# Patient Record
Sex: Male | Born: 1945 | Race: White | Hispanic: No | Marital: Married | Smoking: Former smoker
Health system: Southern US, Community
[De-identification: ages and names within clinical notes are randomized; demographics above are authoritative.]

## PROBLEM LIST (undated history)

## (undated) DIAGNOSIS — E119 Type 2 diabetes mellitus without complications: Secondary | ICD-10-CM

## (undated) DIAGNOSIS — I10 Essential (primary) hypertension: Secondary | ICD-10-CM

## (undated) DIAGNOSIS — E785 Hyperlipidemia, unspecified: Secondary | ICD-10-CM

## (undated) DIAGNOSIS — F419 Anxiety disorder, unspecified: Secondary | ICD-10-CM

## (undated) DIAGNOSIS — I639 Cerebral infarction, unspecified: Secondary | ICD-10-CM

## (undated) DIAGNOSIS — G2581 Restless legs syndrome: Secondary | ICD-10-CM

## (undated) DIAGNOSIS — Z8679 Personal history of other diseases of the circulatory system: Secondary | ICD-10-CM

## (undated) HISTORY — DX: Personal history of other diseases of the circulatory system: Z86.79

## (undated) HISTORY — PX: CORONARY ANGIOPLASTY WITH STENT PLACEMENT: SHX49

## (undated) HISTORY — DX: Hyperlipidemia, unspecified: E78.5

## (undated) HISTORY — DX: Essential (primary) hypertension: I10

## (undated) HISTORY — DX: Anxiety disorder, unspecified: F41.9

## (undated) HISTORY — DX: Restless legs syndrome: G25.81

## (undated) HISTORY — DX: Cerebral infarction, unspecified: I63.9

## (undated) HISTORY — DX: Type 2 diabetes mellitus without complications: E11.9

---

## 2014-12-12 ENCOUNTER — Ambulatory Visit (INDEPENDENT_AMBULATORY_CARE_PROVIDER_SITE_OTHER): Payer: Medicare Other

## 2014-12-12 ENCOUNTER — Ambulatory Visit (INDEPENDENT_AMBULATORY_CARE_PROVIDER_SITE_OTHER): Payer: Medicare Other | Admitting: Family Medicine

## 2014-12-12 VITALS — BP 149/78 | HR 66 | Temp 97.5°F | Resp 16 | Ht 70.75 in | Wt 202.0 lb

## 2014-12-12 DIAGNOSIS — K30 Functional dyspepsia: Secondary | ICD-10-CM

## 2014-12-12 DIAGNOSIS — R1013 Epigastric pain: Secondary | ICD-10-CM

## 2014-12-12 DIAGNOSIS — R079 Chest pain, unspecified: Secondary | ICD-10-CM | POA: Diagnosis not present

## 2014-12-12 NOTE — Progress Notes (Signed)
Subjective:    Patient ID: Derek Blankenship, male    DOB: 06-07-1946, 69 y.o.   MRN: [id number]  HPI  This is a 69 year old male with PMH HTN, HLD and BPH who is presenting with intermittent CP x 2 days. Him and his wife are currently traveling from [place] back to [place] (home). States yesterday morning he had sudden CP prior to having breakfast. State it felt like "an intense burp". CP is substernal with mild radiation into his neck and upper back. He went to the bathroom to try to vomit but nothing came up. He took 4 tums and CP gradually resolved. He denies SOB, palpitations, diaphoresis, numbness. Today he had another episode of CP 2 hours ago that occurred after unloading the car. He again took 4 tums - pain has lessened but is still present. Pt bikes 30 miles a day. States over the past 2 weeks he has had mild CP with sensation of needing to burp in the mornings at the start of his bike ride that resolves after a few miles. He does not take anything for heartburn regularly. He has never had a cardiology work up before.   Review of Systems  Constitutional: Negative for fever, chills and diaphoresis.  HENT: Negative for sore throat and trouble swallowing.   Eyes: Negative for visual disturbance.  Respiratory: Negative for shortness of breath.   Cardiovascular: Positive for chest pain. Negative for palpitations and leg swelling.  Gastrointestinal: Negative for vomiting, abdominal pain and diarrhea.  Skin: Negative for rash.  Neurological: Negative for dizziness, syncope, weakness and numbness.   There are no active problems to display for this patient.  Prior to Admission medications   Medication Sig Start Date End Date Taking? Authorizing Provider  aspirin EC 81 MG tablet Take 81 mg by mouth daily.   Yes Historical Provider, MD  dutasteride (AVODART) 0.5 MG capsule Take 0.5 mg by mouth daily.   Yes Historical Provider, MD  irbesartan (AVAPRO) 150 MG tablet Take 150 mg by mouth daily.   Yes  Historical Provider, MD  sertraline (ZOLOFT) 50 MG tablet Take 50 mg by mouth daily.   Yes Historical Provider, MD  simvastatin (ZOCOR) 10 MG tablet Take 10 mg by mouth daily.   Yes Historical Provider, MD   Allergies not on file  Patient's social and family history were reviewed.     Objective:   Physical Exam  Constitutional: He is oriented to person, place, and time. He appears well-developed and well-nourished. No distress.  HENT:  Head: Normocephalic and atraumatic.  Right Ear: Hearing normal.  Left Ear: Hearing normal.  Nose: Nose normal.  Eyes: Conjunctivae and lids are normal. Right eye exhibits no discharge. Left eye exhibits no discharge. No scleral icterus.  Neck: Carotid bruit is not present.  Cardiovascular: Normal rate, regular rhythm, normal heart sounds and normal pulses.   No murmur heard. Pulmonary/Chest: Effort normal and breath sounds normal. No respiratory distress. He has no wheezes. He has no rhonchi. He has no rales. He exhibits no tenderness.  Abdominal: Soft. Normal appearance. There is no tenderness.  Musculoskeletal: Normal range of motion.  Lymphadenopathy:    He has no cervical adenopathy.  Neurological: He is alert and oriented to person, place, and time. Gait normal.  Skin: Skin is warm, dry and intact. No lesion and no rash noted.  Psychiatric: He has a normal mood and affect. His speech is normal and behavior is normal. Thought content normal.   BP 169/76  mmHg  Pulse 66  Temp(Src) 97.5 F (36.4 C) (Oral)  Resp 16  Ht 5' 10.75" (1.797 m)  Wt 202 lb (91.627 kg)  BMI 28.37 kg/m2  SpO2 96%  BP recheck 149/78  [facility] reading (PRIMARY) by  Dr. Patsy Lageropland: negative  EKG interpreted with Dr. Patsy Lageropland: NSR     Assessment & Plan:  1. Chest pain, unspecified chest pain type 2. indigestion EKG and chest radiograph normal. CP "resolved 90%" with GI cocktail. CP likely d/t indigestion although cardiac etiology cannot be ruled out. Pt opted to try taking  zantac BID to prevent heartburn instead of going to the hospital. Wife insisted if CP occurred again they would go straight to the ED. Advised pt likely needed a stress test. When they arrive back in [place] pt will make appt with PCP and get cardiology work up.  - EKG 12-Lead - DG Chest 2 View; Future - GI cocktail     Roswell MinersNicole V. Dyke BrackettBush, PA-C, MHS [facility]  12/14/2014

## 2014-12-12 NOTE — Patient Instructions (Signed)
Buy zantac at the pharmacy and take in the mornings before breakfast. May take in the evenings before bed as well. If you continue to have episodes of intense chest pain, you need to go to the ED. When you get home, set up an appt with your primary doctor and it would be a good idea to get an exercise stress test.

## 2014-12-13 NOTE — Progress Notes (Signed)
I have examined pt along with Ms. Danae OrleansBush.  Agree that EKG is normal, CXR normal.  Pt reported that GI cocktail resolved "at least 90%" of his chest pain.  Counseled pt and his wife that although this is good news, it is still possible that he has a cardiac origin of his CP.  Advised him that to be certain I would recommend that he go to the ER for a chest pain rule-out.  They declined to do this now, but promised to seek care with their PCP when he gets home, and to discuss cardiology follow-up with PCP.  Also agreed to seek care right away if his CP returns or persists.  His wife informed me that he is has CP again she will insist that they "call [phone]"

## 2014-12-14 ENCOUNTER — Encounter: Payer: Self-pay | Admitting: Physician Assistant

## 2014-12-14 MED ORDER — GI COCKTAIL ~~LOC~~: 30.0000 mL | Freq: Once | ORAL | Status: AC

## 2019-01-12 ENCOUNTER — Observation Stay
Admission: EM | Admit: 2019-01-12 | Discharge: 2019-01-14 | Disposition: A | Discharge status: Home / Self Care | Payer: Medicare Other | Attending: Family Medicine | Admitting: Family Medicine

## 2019-01-12 ENCOUNTER — Emergency Department: Payer: Medicare Other

## 2019-01-12 ENCOUNTER — Emergency Department: Payer: Self-pay

## 2019-01-12 DIAGNOSIS — Z7982 Long term (current) use of aspirin: Secondary | ICD-10-CM | POA: Insufficient documentation

## 2019-01-12 DIAGNOSIS — R079 Chest pain, unspecified: Secondary | ICD-10-CM | POA: Diagnosis present

## 2019-01-12 DIAGNOSIS — Z8673 Personal history of transient ischemic attack (TIA), and cerebral infarction without residual deficits: Secondary | ICD-10-CM | POA: Insufficient documentation

## 2019-01-12 DIAGNOSIS — N400 Enlarged prostate without lower urinary tract symptoms: Secondary | ICD-10-CM | POA: Insufficient documentation

## 2019-01-12 DIAGNOSIS — I10 Essential (primary) hypertension: Secondary | ICD-10-CM | POA: Insufficient documentation

## 2019-01-12 DIAGNOSIS — F419 Anxiety disorder, unspecified: Secondary | ICD-10-CM | POA: Insufficient documentation

## 2019-01-12 DIAGNOSIS — R0602 Shortness of breath: Secondary | ICD-10-CM

## 2019-01-12 DIAGNOSIS — F329 Major depressive disorder, single episode, unspecified: Secondary | ICD-10-CM | POA: Insufficient documentation

## 2019-01-12 DIAGNOSIS — R0789 Other chest pain: Principal | ICD-10-CM | POA: Insufficient documentation

## 2019-01-12 DIAGNOSIS — Z955 Presence of coronary angioplasty implant and graft: Secondary | ICD-10-CM | POA: Insufficient documentation

## 2019-01-12 DIAGNOSIS — E785 Hyperlipidemia, unspecified: Secondary | ICD-10-CM | POA: Insufficient documentation

## 2019-01-12 DIAGNOSIS — I2510 Atherosclerotic heart disease of native coronary artery without angina pectoris: Secondary | ICD-10-CM | POA: Insufficient documentation

## 2019-01-12 DIAGNOSIS — E119 Type 2 diabetes mellitus without complications: Secondary | ICD-10-CM | POA: Insufficient documentation

## 2019-01-12 DIAGNOSIS — G2581 Restless legs syndrome: Secondary | ICD-10-CM | POA: Insufficient documentation

## 2019-01-12 DIAGNOSIS — Z87891 Personal history of nicotine dependence: Secondary | ICD-10-CM | POA: Insufficient documentation

## 2019-01-12 DIAGNOSIS — J929 Pleural plaque without asbestos: Secondary | ICD-10-CM

## 2019-01-12 LAB — CBC AND DIFFERENTIAL
Basophils %: 0.5 % (ref 0.0–3.0)
Basophils Absolute: 0 10*3/uL (ref 0.0–0.3)
Eosinophils %: 4.7 % (ref 0.0–7.0)
Eosinophils Absolute: 0.3 10*3/uL (ref 0.0–0.8)
Hematocrit: 43.8 % (ref 39.0–52.5)
Hemoglobin: 14.9 gm/dL (ref 13.0–17.5)
Lymphocytes Absolute: 1.5 10*3/uL (ref 0.6–5.1)
Lymphocytes: 24.9 % (ref 15.0–46.0)
MCH: 31 pg (ref 28–35)
MCHC: 34 gm/dL (ref 32–36)
MCV: 91 fL (ref 80–100)
MPV: 7.6 fL (ref 6.0–10.0)
Monocytes Absolute: 0.6 10*3/uL (ref 0.1–1.7)
Monocytes: 9.3 % (ref 3.0–15.0)
Neutrophils %: 60.6 % (ref 42.0–78.0)
Neutrophils Absolute: 3.6 10*3/uL (ref 1.7–8.6)
PLT CT: 185 10*3/uL (ref 130–440)
RBC: 4.81 10*6/uL (ref 4.00–5.70)
RDW: 12.1 % (ref 11.0–14.0)
WBC: 5.9 10*3/uL (ref 4.0–11.0)

## 2019-01-12 LAB — COMPREHENSIVE METABOLIC PANEL
ALT: 18 U/L (ref 0–55)
AST (SGOT): 17 U/L (ref 10–42)
Albumin/Globulin Ratio: 1.44 Ratio (ref 0.80–2.00)
Albumin: 3.9 gm/dL (ref 3.5–5.0)
Alkaline Phosphatase: 117 U/L (ref 40–145)
Anion Gap: 13.8 mMol/L (ref 7.0–18.0)
BUN / Creatinine Ratio: 10.9 Ratio (ref 10.0–30.0)
BUN: 12 mg/dL (ref 7–22)
Bilirubin, Total: 0.5 mg/dL (ref 0.1–1.2)
CO2: 22 mMol/L (ref 20.0–30.0)
Calcium: 8.9 mg/dL (ref 8.5–10.5)
Chloride: 104 mMol/L (ref 98–110)
Creatinine: 1.1 mg/dL (ref 0.80–1.30)
EGFR: 67 mL/min/{1.73_m2} (ref 60–150)
Globulin: 2.7 gm/dL (ref 2.0–4.0)
Glucose: 146 mg/dL — ABNORMAL HIGH (ref 71–99)
Osmolality Calculated: 274 mOsm/kg — ABNORMAL LOW (ref 275–300)
Potassium: 3.8 mMol/L (ref 3.5–5.3)
Protein, Total: 6.6 gm/dL (ref 6.0–8.3)
Sodium: 136 mMol/L (ref 136–147)

## 2019-01-12 LAB — D-DIMER, QUANTITATIVE: D-Dimer: 0.98 mg/L FEU — ABNORMAL HIGH (ref 0.19–0.52)

## 2019-01-12 LAB — ECG 12-LEAD
P Wave Axis: 36 deg
P-R Interval: 136 ms
Patient Age: 72 years
Q-T Interval(Corrected): 440 ms
Q-T Interval: 447 ms
QRS Axis: -7 deg
QRS Duration: 102 ms
T Axis: 80 years
Ventricular Rate: 58 //min

## 2019-01-12 LAB — TROPONIN I
Troponin I: 0.02 ng/mL (ref 0.00–0.02)
Troponin I: 0.02 ng/mL (ref 0.00–0.02)
Troponin I: 0.01 ng/mL (ref 0.00–0.02)

## 2019-01-12 LAB — PROCALCITONIN: Procalcitonin: 0.05 ng/mL (ref 0.00–0.24)

## 2019-01-12 LAB — C-REACTIVE PROTEIN: C-Reactive Protein: 1.1 mg/dL — ABNORMAL HIGH (ref 0.02–0.80)

## 2019-01-12 MED ORDER — TERAZOSIN HCL 1 MG PO CAPS
1.00 mg | ORAL_CAPSULE | Freq: Every evening | ORAL | Status: DC
Start: 2019-01-12 — End: 2019-01-14
  Administered 2019-01-12 – 2019-01-13 (×2): 1 mg via ORAL
  Filled 2019-01-12 (×2): qty 1

## 2019-01-12 MED ORDER — SODIUM CHLORIDE (PF) 0.9 % IJ SOLN
3.00 mL | Freq: Three times a day (TID) | INTRAMUSCULAR | Status: DC
Start: 2019-01-12 — End: 2019-01-14
  Administered 2019-01-12 – 2019-01-13 (×2): 3 mL via INTRAVENOUS

## 2019-01-12 MED ORDER — RANOLAZINE ER 500 MG PO TB12
1000.00 mg | ORAL_TABLET | Freq: Two times a day (BID) | ORAL | Status: DC
Start: 2019-01-12 — End: 2019-01-14
  Administered 2019-01-12 – 2019-01-14 (×5): 1000 mg via ORAL
  Filled 2019-01-12 (×5): qty 2

## 2019-01-12 MED ORDER — MORPHINE SULFATE 2 MG/ML IJ/IV SOLN (WRAP)
2.0000 mg | Status: DC | PRN
Start: 2019-01-12 — End: 2019-01-14

## 2019-01-12 MED ORDER — NALOXONE HCL 0.4 MG/ML IJ SOLN (WRAP)
0.40 mg | INTRAMUSCULAR | Status: DC | PRN
Start: 2019-01-12 — End: 2019-01-14

## 2019-01-12 MED ORDER — ISOSORBIDE MONONITRATE 20 MG PO TABS
60.0000 mg | ORAL_TABLET | Freq: Every day | ORAL | Status: DC
Start: 2019-01-12 — End: 2019-01-14
  Administered 2019-01-12 – 2019-01-14 (×3): 60 mg via ORAL
  Filled 2019-01-12 (×3): qty 3

## 2019-01-12 MED ORDER — IOHEXOL 350 MG/ML IV SOLN
70.00 mL | Freq: Once | INTRAVENOUS | Status: AC | PRN
Start: 2019-01-12 — End: 2019-01-12
  Administered 2019-01-12: 03:00:00 70 mL via INTRAVENOUS

## 2019-01-12 MED ORDER — VH BIO-K PLUS PROBIOTIC 50 BIL CFU CAPSULE
50.00 | DELAYED_RELEASE_CAPSULE | Freq: Every day | ORAL | Status: DC
Start: 2019-01-12 — End: 2019-01-14
  Administered 2019-01-12 – 2019-01-14 (×3): 50 via ORAL
  Filled 2019-01-12 (×3): qty 1

## 2019-01-12 MED ORDER — CYCLOBENZAPRINE HCL 10 MG PO TABS
5.0000 mg | ORAL_TABLET | Freq: Three times a day (TID) | ORAL | Status: DC | PRN
Start: 2019-01-12 — End: 2019-01-14

## 2019-01-12 MED ORDER — HYDROXYZINE HCL 25 MG PO TABS
75.0000 mg | ORAL_TABLET | Freq: Every evening | ORAL | Status: DC
Start: 2019-01-12 — End: 2019-01-14
  Administered 2019-01-12 – 2019-01-13 (×2): 75 mg via ORAL
  Filled 2019-01-12 (×2): qty 3

## 2019-01-12 MED ORDER — ONDANSETRON 4 MG PO TBDP
4.00 mg | ORAL_TABLET | Freq: Three times a day (TID) | ORAL | Status: DC | PRN
Start: 2019-01-12 — End: 2019-01-14

## 2019-01-12 MED ORDER — ASPIRIN 81 MG PO CHEW
CHEWABLE_TABLET | ORAL | Status: AC
Start: 2019-01-12 — End: ?
  Filled 2019-01-12: qty 4

## 2019-01-12 MED ORDER — AZITHROMYCIN 500 MG IV SOLR
500.00 mg | INTRAVENOUS | Status: DC
Start: 2019-01-12 — End: 2019-01-12
  Administered 2019-01-12: 07:00:00 500 mg via INTRAVENOUS
  Filled 2019-01-12: qty 500

## 2019-01-12 MED ORDER — ASPIRIN 81 MG PO CHEW
81.00 mg | CHEWABLE_TABLET | Freq: Every day | ORAL | Status: DC
Start: 2019-01-13 — End: 2019-01-14
  Administered 2019-01-13 – 2019-01-14 (×2): 81 mg via ORAL
  Filled 2019-01-12 (×2): qty 1

## 2019-01-12 MED ORDER — LISINOPRIL 20 MG PO TABS
20.0000 mg | ORAL_TABLET | Freq: Every day | ORAL | Status: DC
Start: 2019-01-12 — End: 2019-01-14
  Administered 2019-01-12 – 2019-01-14 (×3): 20 mg via ORAL
  Filled 2019-01-12 (×3): qty 1

## 2019-01-12 MED ORDER — ISOSORBIDE MONONITRATE 20 MG PO TABS
60.0000 mg | ORAL_TABLET | Freq: Two times a day (BID) | ORAL | Status: DC
Start: 2019-01-12 — End: 2019-01-12

## 2019-01-12 MED ORDER — TRAZODONE HCL 50 MG PO TABS
100.0000 mg | ORAL_TABLET | Freq: Every evening | ORAL | Status: DC
Start: 2019-01-12 — End: 2019-01-14
  Administered 2019-01-12 – 2019-01-13 (×2): 100 mg via ORAL
  Filled 2019-01-12 (×2): qty 2

## 2019-01-12 MED ORDER — ATORVASTATIN CALCIUM 40 MG PO TABS
80.0000 mg | ORAL_TABLET | Freq: Every evening | ORAL | Status: DC
Start: 2019-01-12 — End: 2019-01-14
  Administered 2019-01-12 – 2019-01-13 (×2): 80 mg via ORAL
  Filled 2019-01-12 (×2): qty 2

## 2019-01-12 MED ORDER — SODIUM CHLORIDE 0.9 % IV SOLN
INTRAVENOUS | Status: DC
Start: 2019-01-12 — End: 2019-01-13

## 2019-01-12 MED ORDER — ENOXAPARIN SODIUM 40 MG/0.4ML SC SOLN
40.00 mg | SUBCUTANEOUS | Status: DC
Start: 2019-01-12 — End: 2019-01-13
  Administered 2019-01-12 – 2019-01-13 (×2): 40 mg via SUBCUTANEOUS
  Filled 2019-01-12 (×2): qty 0.4

## 2019-01-12 MED ORDER — ONDANSETRON HCL 4 MG/2ML IJ SOLN
4.00 mg | Freq: Three times a day (TID) | INTRAMUSCULAR | Status: DC | PRN
Start: 2019-01-12 — End: 2019-01-14

## 2019-01-12 MED ORDER — ASPIRIN 81 MG PO CHEW
324.00 mg | CHEWABLE_TABLET | Freq: Once | ORAL | Status: AC
Start: 2019-01-12 — End: 2019-01-12
  Administered 2019-01-12: 04:00:00 324 mg via ORAL

## 2019-01-12 NOTE — Progress Notes (Addendum)
Report received from [employer]. Patient resting with eyes closed, call bell in reach. No patient requests at this time.

## 2019-01-12 NOTE — H&P (Signed)
Medicine History & Physical   [facility]  [employer]   Patient Name: Trevor Fisher, Trevor Fisher LOS: 0 days   Attending Physician: Wynonia Hazard, MD PCP: Pcp, None, MD      Assessment and Plan:                                                              Chest pain:  The patient has a history of coronary artery disease with stent placement in the past.  He does have chronic chest pain requiring Imdur and Ranexa.  Onset of the chest pain was yesterday, this chest pain is right-sided, less likely to be cardiac.  Troponin remains within normal range.  The chest pain is reproducible on palpation and there is a musculoskeletal component to it.  CT of the chest revealed no evidence of pulmonary embolism.   Placed the patient on observation   Obtain serial cardiac enzymes and EKGs   Continue aspirin, statins, lisinopril.   Continue Imdur and Ranexa.    Abnormal CT of the chest:  CT revealed no evidence of pulmonary embolus, mild bilateral peribronchial thickening and groundglass opacity favoring infection/inflammation.  There is evidence of bilateral scattered pleural thickening.  The patient has a history of asbestosis for which she will need outpatient follow-up.  Repeat CT in 6 months is recommended.  Initiate intravenous antibiotics for possible infection and assess for clinical response.    Essential hypertension:  Blood pressure controlled.  Continue lisinopril.    Hyperlipidemia:  Continue atorvastatin.    Anxiety/depression:  Continue trazodone.    DVT PPx: Lovenox   Dispo: Observation  Healthcare Proxy: Spouse  Code: full code     History of Presenting Illness                                Chief complaint: Chest pain.  Trevor Fisher is a 73 y.o. male patient with past medical history significant for coronary disease status post stent placement, hypertension, hyperlipidemia, anxiety, depression, who presented to the emergency department with complaints of chest pain for 2 days.  Onset of the chest  pain was 1 day prior to presentation.  At the time the chest pain was right-sided, rated 10 out of 10, intermittent.  The chest pain radiates to his back and right shoulder.  He admits to shortness of breath that is chronic and unchanged.  No reported lower extremity swelling.  The patient admits to a mild nonproductive cough.  No palpitation, no PND, no orthopnea.  No abdominal pain.  The patient is currently visiting [place] from [place].  He denies fever.  No reported sick contact.  Past Medical History:   Diagnosis Date    Anxiety     Cerebrovascular accident     History of angina     Hyperlipidemia     Hypertension     Restless leg syndrome      Past Surgical History:   Procedure Laterality Date    CORONARY ANGIOPLASTY WITH STENT PLACEMENT       History reviewed. No pertinent family history.  Social History     Tobacco Use    Smoking status: Former Smoker    Smokeless tobacco: Never Used   Substance  Use Topics    Alcohol use: Not Currently     Frequency: Never     Comment: occ beer    Drug use: Never     Subjective   Review of Systems:  Review of systems as noted in HPI. Full 12 point ROS otherwise negative.         Objective   Physical Exam:     Vitals: T:97.7 F (36.5 C),  BP:101/77, HR:63, RR:15, SaO2:97%    1) General Appearance: Middle-age male, lying in bed, no acute respiratory distress.  Anxious appearing.  2) Eyes: Pink conjunctiva, anicteric sclera. Pupils are equally reactive to light.  3) ENT: Oral mucosa moist with no pharyngeal congestion, erythema or swelling.  4) Neck: Supple, with full range of motion. Trachea is central, no JVD noted  5) Chest: Positive chest wall pain on the right side that reproduces the patient's pain.  Lungs clear to auscultation bilaterally, no wheezes or rhonchi.  6) CVS: normal rate and regular rhythm, with no murmurs.  7) Abdomen: Soft, non-tender, no palpable mass. Bowel sounds normal. No CVA tenderness  8) Extremities: No pitting edema, pulses palpable,  no calf swelling and no gross deformity.  9) Skin: Warm, dry with normal skin turgor, no rash  10) Lymphatics: No lymphadenopathy in axillary, cervical and inguinal area.   11) Neurological: Cranial nerves II-XII intact. No gross focal motor or sensory deficits noted.  12) Psychiatric: Affect is appropriate. No hallucinations.    EKG: Per my review shows sinus rhythm.    Patient Vitals for the past 12 hrs:   BP Temp Pulse Resp   01/12/19 0432 101/77 -- 63 15   01/12/19 0422 126/71 -- (!) 59 (!) 23   01/12/19 0323 144/78 -- (!) 59 20   01/12/19 0301 124/76 -- 66 20   01/12/19 0232 140/70 -- 71 18   01/12/19 0218 131/80 -- 61 20   01/12/19 0156 (!) 140/98 97.7 F (36.5 C) -- 18     Weight Monitoring 01/12/2019   Height 185.4 cm   Height Method Stated   Weight 98.884 kg   Weight Method Stated   BMI (calculated) 28.8 kg/m2          Recent Results (from the past 24 hour(s))   ECG 12 lead (Stat)    Collection Time: 01/12/19  2:12 AM   Result Value Ref Range    Patient Age 73 years    Patient DOB 1946-04-08     Patient Height      Patient Weight      Interpretation Text       Sinus rhythm  Borderline T abnormalities, anterior leads  No previous ECG available for comparison      Physician Interpreter      Ventricular Rate 58 //min    QRS Duration 102 ms    P-R Interval 136 ms    Q-T Interval 447 ms    Q-T Interval(Corrected) 440 ms    P Wave Axis 36 deg    QRS Axis -7 deg    T Axis 80 years   CBC and differential    Collection Time: 01/12/19  2:14 AM   Result Value Ref Range    WBC 5.9 4.0 - 11.0 K/cmm    RBC 4.81 4.00 - 5.70 M/cmm    Hemoglobin 14.9 13.0 - 17.5 gm/dL    Hematocrit 60.4 54.0 - 52.5 %    MCV 91 80 - 100 fL    MCH 31  28 - 35 pg    MCHC 34 32 - 36 gm/dL    RDW 16.1 09.6 - 04.5 %    PLT CT 185 130 - 440 K/cmm    MPV 7.6 6.0 - 10.0 fL    NEUTROPHIL % 60.6 42.0 - 78.0 %    Lymphocytes 24.9 15.0 - 46.0 %    Monocytes 9.3 3.0 - 15.0 %    Eosinophils % 4.7 0.0 - 7.0 %    Basophils % 0.5 0.0 - 3.0 %    Neutrophils  Absolute 3.6 1.7 - 8.6 K/cmm    Lymphocytes Absolute 1.5 0.6 - 5.1 K/cmm    Monocytes Absolute 0.6 0.1 - 1.7 K/cmm    Eosinophils Absolute 0.3 0.0 - 0.8 K/cmm    BASO Absolute 0.0 0.0 - 0.3 K/cmm   Comprehensive metabolic panel    Collection Time: 01/12/19  2:14 AM   Result Value Ref Range    Sodium 136 136 - 147 mMol/L    Potassium 3.8 3.5 - 5.3 mMol/L    Chloride 104 98 - 110 mMol/L    CO2 22.0 20.0 - 30.0 mMol/L    Calcium 8.9 8.5 - 10.5 mg/dL    Glucose 409 (H) 71 - 99 mg/dL    Creatinine 8.11 9.14 - 1.30 mg/dL    BUN 12 7 - 22 mg/dL    Protein, Total 6.6 6.0 - 8.3 gm/dL    Albumin 3.9 3.5 - 5.0 gm/dL    Alkaline Phosphatase 117 40 - 145 U/L    ALT 18 0 - 55 U/L    AST (SGOT) 17 10 - 42 U/L    Bilirubin, Total 0.5 0.1 - 1.2 mg/dL    Albumin/Globulin Ratio 1.44 0.80 - 2.00 Ratio    Anion Gap 13.8 7.0 - 18.0 mMol/L    BUN/Creatinine Ratio 10.9 10.0 - 30.0 Ratio    EGFR 67 60 - 150 mL/min/1.31m2    Osmolality Calculated 274 (L) 275 - 300 mOsm/kg    Globulin 2.7 2.0 - 4.0 gm/dL   Troponin I    Collection Time: 01/12/19  2:14 AM   Result Value Ref Range    Troponin I 0.02 0.00 - 0.02 ng/mL   D-dimer, quantitative    Collection Time: 01/12/19  2:14 AM   Result Value Ref Range    D-Dimer 0.98 (H) 0.19 - 0.52 mg/L FEU        No Known Allergies   Ct Angiogram Chest (pe)    Result Date: 01/12/2019  1. No pulmonary embolism. 2. Mild bilateral peribronchial thickening and groundglass opacities favoring infection/inflammation. Imaging features are atypical or uncommonly reported for viral pneumonia. Alternative diagnoses should be considered. 4. Bilateral scattered pleural thickening, corresponding to the chest x-ray finding. Follow-up CT in 6 months is recommended. ReadingStation:[id number]    Xr Chest Ap Portable    Result Date: 01/12/2019  1. No acute cardiopulmonary changes are identified. 2. Possible small right upper lobe pulmonary nodule. Recommend follow-up nonemergent chest CT. ReadingStation:[id number]     Home  Medications             aspirin 81 MG chewable tablet     Chew 81 mg by mouth daily     ATORVASTATIN CALCIUM PO     Take 80 mg by mouth     cyanocobalamin 1000 MCG tablet     Take 1,000 mcg by mouth daily     cyclobenzaprine (FLEXERIL) 5 MG tablet     Take 5 mg by  mouth 3 (three) times daily as needed for Muscle spasms     hydrOXYzine (VISTARIL) 100 MG capsule     Take 100 mg by mouth 3 (three) times daily as needed     isosorbide mononitrate (ISMO,MONOKET) 20 MG tablet     Take 60 mg by mouth 2 (two) times daily     LACTOBACILLUS PO     Take by mouth     lisinopril (PRINIVIL,ZESTRIL) 20 MG tablet     Take 20 mg by mouth daily     nitroglycerin (NITROSTAT) 0.4 MG SL tablet     Place 0.4 mg under the tongue every 5 (five) minutes as needed for Chest pain     PRAMIPEXOLE DIHYDROCHLORIDE ER PO     Take 0.25 mg by mouth     ranolazine (RANEXA) 500 MG 12 hr tablet     Take 1,000 mg by mouth 2 (two) times daily     terazosin (HYTRIN) 1 MG capsule     Take 1 mg by mouth nightly     traZODone (DESYREL) 100 MG tablet     Take 100 mg by mouth nightly         Meds given in the ED:  Medications   iohexol (OMNIPAQUE) 350 MG/ML injection 70 mL (70 mLs Intravenous Imaging Agent Given 01/12/19 0321)   aspirin chewable tablet 324 mg (324 mg Oral Given 01/12/19 0428)      Time Spent:     Jesse Fall, MD     01/12/19,5:22 AM   MRN: [id number]                                      CSN: [id number] DOB: May 16, 1946

## 2019-01-12 NOTE — Progress Notes (Signed)
Patient admitted to room 207B. Patient alert and oriented x4. Patient able to ambulate into room without assistance, steady gait noted. Patient denies pain or discomfort at this time. Patient oriented to room, staff and plan of care. Patient states understanding and agrees with plan of care at this time. Patient placed on telemetry. Call bell in reach, able to return demo.

## 2019-01-12 NOTE — ED Provider Notes (Signed)
Physician/Midlevel provider first contact with patient: 01/12/19 0159         History     Chief Complaint   Patient presents with    Shoulder Pain    Back Pain    Chest Pain   Patient complains of worsening chest pain off and on since yesterday morning.  It was really bad yesterday morning he took 2 nitro which helped quieted down.  States he is chronically gets chest pain off and on.  Also complains of chronic shortness of breath which is not any different.  The last 4 days he has had right shoulder blade pain.  States pain is worse when he moves around.  Patient also gets worsening chest pain when he moves around.  Patient has known coronary artery disease with a stent placed approximately 3 years ago.          Past Medical History:   Diagnosis Date    Anxiety     Cerebrovascular accident     History of angina     Hyperlipidemia     Hypertension     Restless leg syndrome        Past Surgical History:   Procedure Laterality Date    CORONARY ANGIOPLASTY WITH STENT PLACEMENT         History reviewed. No pertinent family history.    Social  Social History     Tobacco Use    Smoking status: Former Smoker    Smokeless tobacco: Never Used   Substance Use Topics    Alcohol use: Not Currently     Frequency: Never     Comment: occ beer    Drug use: Never       .     No Known Allergies    Home Medications             aspirin 81 MG chewable tablet     Chew 81 mg by mouth daily     ATORVASTATIN CALCIUM PO     Take 80 mg by mouth     cyanocobalamin 1000 MCG tablet     Take 1,000 mcg by mouth daily     cyclobenzaprine (FLEXERIL) 5 MG tablet     Take 5 mg by mouth 3 (three) times daily as needed for Muscle spasms     hydrOXYzine (VISTARIL) 100 MG capsule     Take 100 mg by mouth 3 (three) times daily as needed     isosorbide mononitrate (ISMO,MONOKET) 20 MG tablet     Take 60 mg by mouth 2 (two) times daily     LACTOBACILLUS PO     Take by mouth     lisinopril (PRINIVIL,ZESTRIL) 20 MG tablet     Take 20 mg by  mouth daily     nitroglycerin (NITROSTAT) 0.4 MG SL tablet     Place 0.4 mg under the tongue every 5 (five) minutes as needed for Chest pain     PRAMIPEXOLE DIHYDROCHLORIDE ER PO     Take 0.25 mg by mouth     ranolazine (RANEXA) 500 MG 12 hr tablet     Take 1,000 mg by mouth 2 (two) times daily     terazosin (HYTRIN) 1 MG capsule     Take 1 mg by mouth nightly     traZODone (DESYREL) 100 MG tablet     Take 100 mg by mouth nightly           Review of Systems   Constitutional: Positive  for activity change. Negative for appetite change, chills and fever.   HENT: Negative for congestion and sore throat.    Eyes: Negative for visual disturbance.   Respiratory: Positive for shortness of breath (This this is normal). Negative for cough.    Cardiovascular: Positive for chest pain. Negative for leg swelling.   Gastrointestinal: Positive for abdominal pain and nausea. Negative for diarrhea and vomiting.   Genitourinary: Positive for difficulty urinating (This is his baseline states he takes medicine for this) and frequency.   Musculoskeletal: Positive for back pain.   Skin: Negative for rash and wound.   Neurological: Negative for dizziness and headaches.   Hematological: Negative for adenopathy.   Psychiatric/Behavioral: Negative for confusion.   All other systems reviewed and are negative.      Physical Exam    BP: (!) 140/98, Heart Rate: 61, Temp: 97.7 F (36.5 C), Resp Rate: 18, SpO2: 100 %, Weight: 98.9 kg     Physical Exam  Vitals signs and nursing note reviewed.   Constitutional:       Appearance: He is well-developed.   HENT:      Head: Atraumatic.   Eyes:      Extraocular Movements: Extraocular movements intact.      Pupils: Pupils are equal, round, and reactive to light.   Neck:      Musculoskeletal: Normal range of motion and neck supple.   Cardiovascular:      Rate and Rhythm: Normal rate and regular rhythm.      Heart sounds: Normal heart sounds.   Pulmonary:      Effort: Pulmonary effort is normal.      Breath  sounds: Normal breath sounds.   Abdominal:      Palpations: Abdomen is soft.      Tenderness: There is no abdominal tenderness. There is no guarding or rebound.   Musculoskeletal:      Right lower leg: He exhibits no tenderness. No edema.      Left lower leg: He exhibits no tenderness. No edema.   Skin:     General: Skin is warm and dry.      Capillary Refill: Capillary refill takes less than 2 seconds.   Neurological:      General: No focal deficit present.      Mental Status: He is alert.   Psychiatric:         Mood and Affect: Mood normal.           MDM and ED Course     ED Medication Orders (From admission, onward)    Start Ordered     Status Ordering Provider    01/12/19 0425 01/12/19 0424  aspirin chewable tablet 324 mg  Once in ED     Route: Oral  Ordered Dose: 324 mg     Last MAR action:  Given Talmage Coin    01/12/19 0315 01/12/19 0315  iohexol (OMNIPAQUE) 350 MG/ML injection 70 mL  IMG once as needed     Route: Intravenous  Ordered Dose: 70 mL     Last MAR action:  Imaging Agent Given Talmage Coin         Results     Procedure Component Value Units Date/Time    Troponin I [[id number]] Collected:  01/12/19 0455    Specimen:  Plasma Updated:  01/12/19 0507    Troponin I [[id number]] Collected:  01/12/19 0214    Specimen:  Plasma Updated:  01/12/19 0303     Troponin  I 0.02 ng/mL     Comprehensive metabolic panel [[id number]]  (Abnormal) Collected:  01/12/19 0214    Specimen:  Plasma Updated:  01/12/19 0258     Sodium 136 mMol/L      Potassium 3.8 mMol/L      Chloride 104 mMol/L      CO2 22.0 mMol/L      Calcium 8.9 mg/dL      Glucose 409 mg/dL      Creatinine 8.11 mg/dL      BUN 12 mg/dL      Protein, Total 6.6 gm/dL      Albumin 3.9 gm/dL      Alkaline Phosphatase 117 U/L      ALT 18 U/L      AST (SGOT) 17 U/L      Bilirubin, Total 0.5 mg/dL      Albumin/Globulin Ratio 1.44 Ratio      Anion Gap 13.8 mMol/L      BUN/Creatinine Ratio 10.9 Ratio      EGFR 67 mL/min/1.57m2      Osmolality  Calculated 274 mOsm/kg      Globulin 2.7 gm/dL     D-dimer, quantitative [[id number]]  (Abnormal) Collected:  01/12/19 0214    Specimen:  Blood Updated:  01/12/19 0249     D-Dimer 0.98 mg/L FEU     CBC and differential [[id number]] Collected:  01/12/19 0214    Specimen:  Blood Updated:  01/12/19 0237     WBC 5.9 K/cmm      RBC 4.81 M/cmm      Hemoglobin 14.9 gm/dL      Hematocrit 46.9 %      MCV 91 fL      MCH 31 pg      MCHC 34 gm/dL      RDW 62.9 %      PLT CT 185 K/cmm      MPV 7.6 fL      NEUTROPHIL % 60.6 %      Lymphocytes 24.9 %      Monocytes 9.3 %      Eosinophils % 4.7 %      Basophils % 0.5 %      Neutrophils Absolute 3.6 K/cmm      Lymphocytes Absolute 1.5 K/cmm      Monocytes Absolute 0.6 K/cmm      Eosinophils Absolute 0.3 K/cmm      BASO Absolute 0.0 K/cmm         Ct Angiogram Chest (pe)    Result Date: 01/12/2019  1. No pulmonary embolism. 2. Mild bilateral peribronchial thickening and groundglass opacities favoring infection/inflammation. Imaging features are atypical or uncommonly reported for viral pneumonia. Alternative diagnoses should be considered. 4. Bilateral scattered pleural thickening, corresponding to the chest x-ray finding. Follow-up CT in 6 months is recommended. ReadingStation:[id number]    Xr Chest Ap Portable    Result Date: 01/12/2019  1. No acute cardiopulmonary changes are identified. 2. Possible small right upper lobe pulmonary nodule. Recommend follow-up nonemergent chest CT. ReadingStation:[email or web address]        I spoke to Dr. Horald Pollen to evaluate for possible admission for rule out ACS.  MDM  The patient presents with chest pain of unclear etiology.  Initial cardiac markers and EKG are nondiagnostic. Based upon the presentation the patient appears to be at low risk for PE, aortic dissection, pneumothorax, pneumonia or other serious conditions. The plan is to admit this patient for cardiac evaluation, observation and further testing.  The admission plan was  discussed with the  patient and/or family and they will comply.  Results of lab/radiology/EKG tests were discussed with the patient and/or family. All questions were answered and concerns addressed and appropriate consultation was obtained.    < 30 min               Procedures    Clinical Impression & Disposition     Clinical Impression  Final diagnoses:   Acute chest pain        ED Disposition     ED Disposition Condition Date/Time Comment    Admit  Sat Jan 12, 2019  4:14 AM            New Prescriptions    No medications on file    This note was completed using dragon medical speech recognition software. Grammatical erors, random word insertions, pronoun errors, incorrect word insertion, misspellings and incomplete sentences are occasional consequences of this technology due to software limitations. If there are questions or concerns about the content of this note or information contained within the body of this dictation they should be addressed with the provider for clarification.               Talmage Coin, MD  01/12/19 [phone]

## 2019-01-12 NOTE — ED Notes (Signed)
MD at bedside.  Discussing admission

## 2019-01-12 NOTE — Progress Notes (Signed)
This patient was admitted earlier today for chest pain.  I discussed this with the patient further-although he is ruled out for an acute event with negative enzymes x3, he does admit to worsening exertional dyspnea and exertional chest pain over the past several months, particularly in the last few days.  He does have a history of known coronary artery disease for which he has undergone multiple cardiac catheterizations in the past, most recently 14 months ago.  The discomfort that he is experiencing now is clearly worse and more exertion related than any symptoms he has had since prior to his most recent cardiac cath.  He states that his ability to "do things" is now severely limited by the pain that occurs.  For this reason, although he is ruled out for an acute event, I have recommended that he stay in until Monday in order to discuss management strategies with cardiology including possible cardiac catheterization.  In case this is decided on, I will make the patient n.p.o. after midnight, Sunday night.    Wynonia Hazard, MD

## 2019-01-12 NOTE — UM Notes (Signed)
[facility] Utilization Management Review Sheet    Facility :  [facility]    NAME: Trevor Fisher  MR#: [id number]    CSN#: [id number]    ROOM: 207/207-B AGE: 73 y.o.    ADMIT DATE AND TIME: 01/12/2019  1:52 AM      PATIENT CLASS: Observation status    ATTENDING PHYSICIAN: Wynonia Hazard, MD  PAYOR:Payor: MEDICARE / Plan: MEDICARE PART A AND B / Product Type: Medicare /       AUTH #:     DIAGNOSIS:     ICD-10-CM    1. Acute chest pain R07.9        HISTORY:   Past Medical History:   Diagnosis Date    Anxiety     Cerebrovascular accident     Diabetes mellitus     History of angina     Hyperlipidemia     Hypertension     Restless leg syndrome        DATE OF REVIEW: 01/12/2019    VITALS: BP 140/78    Pulse (!) 57    Temp 97.5 F (36.4 C) (Oral)    Resp 20    Ht 1.854 m (6\' 1" )    Wt 100.1 kg (220 lb 10.9 oz)    SpO2 97%    BMI 29.12 kg/m     Active Hospital Problems    Diagnosis    Chest pain    HTN (hypertension)    HLD (hyperlipidemia)    CAD (coronary artery disease)    Anxiety and depression       UR NOTE:  ER admit  Observation status    Per MD H&P note:  Chief complaint: Chest pain.  Trevor Fisher is a 73 y.o. male patient with past medical history significant for coronary disease status post stent placement, hypertension, hyperlipidemia, anxiety, depression, who presented to the emergency department with complaints of chest pain for 2 days.  Onset of the chest pain was 1 day prior to presentation.  At the time the chest pain was right-sided, rated 10 out of 10, intermittent.  The chest pain radiates to his back and right shoulder.  He admits to shortness of breath that is chronic and unchanged.  No reported lower extremity swelling.  The patient admits to a mild nonproductive cough.  No palpitation, no PND, no orthopnea.  No abdominal pain.  The patient is currently visiting [place] from [place].  He denies fever.  No reported sick contact.    Assessment and Plan:                                                               Chest pain:  The patient has a history of coronary artery disease with stent placement in the past.  He does have chronic chest pain requiring Imdur and Ranexa.  Onset of the chest pain was yesterday, this chest pain is right-sided, less likely to be cardiac.  Troponin remains within normal range.  The chest pain is reproducible on palpation and there is a musculoskeletal component to it.  CT of the chest revealed no evidence of pulmonary embolism.   Placed the patient on observation   Obtain serial cardiac enzymes and EKGs   Continue aspirin, statins, lisinopril.   Continue  Imdur and Ranexa.    Abnormal CT of the chest:  CT revealed no evidence of pulmonary embolus, mild bilateral peribronchial thickening and groundglass opacity favoring infection/inflammation.  There is evidence of bilateral scattered pleural thickening.  The patient has a history of asbestosis for which she will need outpatient follow-up.  Repeat CT in 6 months is recommended.  Initiate intravenous antibiotics for possible infection and assess for clinical response.    Essential hypertension:  Blood pressure controlled.  Continue lisinopril.    Hyperlipidemia:  Continue atorvastatin.    Anxiety/depression:  Continue trazodone.    DVT PPx: Lovenox   Dispo: Observation       LABS:  Trop 0.02, 0.02  D dimer 0.98    CT chest shows;  IMPRESSION:   1. No pulmonary embolism.  2. Mild bilateral peribronchial thickening and groundglass opacities favoring infection/inflammation. Imaging features are atypical or uncommonly reported for viral pneumonia. Alternative diagnoses should be considered.  4. Bilateral scattered pleural thickening, corresponding to the chest x-ray finding. Follow-up CT in 6 months is recommended.      MEDS:  IVF NS @ 100 cc/hr  IV Zithromax daily      OBS order  OBS under OC-009    Please call RN with any questions  Phone [phone]  Fax [phone]  Thank you

## 2019-01-12 NOTE — ED Notes (Signed)
Pt walked to restroom with steady gait - vss

## 2019-01-12 NOTE — Plan of Care (Signed)
Problem: Moderate/High Fall Risk Score >5  Goal: Patient will remain free of falls  Outcome: Progressing  Flowsheets (Taken 01/12/2019 0739)  VH Moderate Risk (6-13): ALL REQUIRED LOW INTERVENTIONS;INITIATE YELLOW "FALL RISK" SIGNAGE;YELLOW "FALL RISK" ARM BAND;USE OF BED EXIT ALARM IF PATIENT IS CONFUSED OR IMPULSIVE. PLACE RESET BED ALARM SIGN ABOVE BED;PLACE FALL RISK LEVEL ON WHITE BOARD FOR COMMUNICATION PURPOSES IN PATIENT'S ROOM

## 2019-01-13 DIAGNOSIS — R911 Solitary pulmonary nodule: Secondary | ICD-10-CM | POA: Insufficient documentation

## 2019-01-13 LAB — COMPREHENSIVE METABOLIC PANEL
ALT: 15 U/L (ref 0–55)
AST (SGOT): 15 U/L (ref 10–42)
Albumin/Globulin Ratio: 1.5 Ratio (ref 0.80–2.00)
Albumin: 3.6 gm/dL (ref 3.5–5.0)
Alkaline Phosphatase: 67 U/L (ref 40–145)
Anion Gap: 11.2 mMol/L (ref 7.0–18.0)
BUN / Creatinine Ratio: 11.8 Ratio (ref 10.0–30.0)
BUN: 13 mg/dL (ref 7–22)
Bilirubin, Total: 0.7 mg/dL (ref 0.1–1.2)
CO2: 25 mMol/L (ref 20.0–30.0)
Calcium: 8.5 mg/dL (ref 8.5–10.5)
Chloride: 104 mMol/L (ref 98–110)
Creatinine: 1.1 mg/dL (ref 0.80–1.30)
EGFR: 67 mL/min/{1.73_m2} (ref 60–150)
Globulin: 2.4 gm/dL (ref 2.0–4.0)
Glucose: 130 mg/dL — ABNORMAL HIGH (ref 71–99)
Osmolality Calculated: 274 mOsm/kg — ABNORMAL LOW (ref 275–300)
Potassium: 4.2 mMol/L (ref 3.5–5.3)
Protein, Total: 6 gm/dL (ref 6.0–8.3)
Sodium: 136 mMol/L (ref 136–147)

## 2019-01-13 LAB — CBC AND DIFFERENTIAL
Basophils %: 0.2 % (ref 0.0–3.0)
Basophils Absolute: 0 10*3/uL (ref 0.0–0.3)
Eosinophils %: 3.8 % (ref 0.0–7.0)
Eosinophils Absolute: 0.3 10*3/uL (ref 0.0–0.8)
Hematocrit: 41.2 % (ref 39.0–52.5)
Hemoglobin: 14.4 gm/dL (ref 13.0–17.5)
Lymphocytes Absolute: 1.3 10*3/uL (ref 0.6–5.1)
Lymphocytes: 18.2 % (ref 15.0–46.0)
MCH: 32 pg (ref 28–35)
MCHC: 35 gm/dL (ref 32–36)
MCV: 91 fL (ref 80–100)
MPV: 8.8 fL (ref 6.0–10.0)
Monocytes Absolute: 0.6 10*3/uL (ref 0.1–1.7)
Monocytes: 9 % (ref 3.0–15.0)
Neutrophils %: 68.8 % (ref 42.0–78.0)
Neutrophils Absolute: 4.8 10*3/uL (ref 1.7–8.6)
PLT CT: 144 10*3/uL (ref 130–440)
RBC: 4.52 10*6/uL (ref 4.00–5.70)
RDW: 12.1 % (ref 11.0–14.0)
WBC: 7 10*3/uL (ref 4.0–11.0)

## 2019-01-13 LAB — PT/INR
PT INR: 1.1 (ref 0.9–1.2)
PT: 10.9 s (ref 9.4–11.5)

## 2019-01-13 LAB — APTT
aPTT: 33 s (ref 22.0–33.0)
aPTT: 37.6 s — ABNORMAL HIGH (ref 22.0–33.0)

## 2019-01-13 LAB — TROPONIN I: Troponin I: 0 ng/mL (ref 0.00–0.02)

## 2019-01-13 MED ORDER — ALPRAZOLAM 0.25 MG PO TABS
0.2500 mg | ORAL_TABLET | Freq: Three times a day (TID) | ORAL | Status: DC | PRN
Start: 2019-01-13 — End: 2019-01-14

## 2019-01-13 MED ORDER — VH HEPARIN (PORCINE) IN D5W 50-5 UNIT/ML-% IV SOLN
0.00 [IU]/h | INTRAVENOUS | Status: DC
Start: 2019-01-13 — End: 2019-01-14
  Administered 2019-01-13: 14:00:00 1000 [IU]/h via INTRAVENOUS
  Administered 2019-01-13: 22:00:00 1150 [IU]/h via INTRAVENOUS
  Administered 2019-01-14 (×2): 1250 [IU]/h via INTRAVENOUS
  Filled 2019-01-13 (×2): qty 500

## 2019-01-13 MED ORDER — VH HEPARIN SODIUM (PORCINE) 10000 UNIT/ML IJ SOLN (REBOLUS)
3000.00 [IU] | Freq: Four times a day (QID) | INTRAMUSCULAR | Status: DC | PRN
Start: 2019-01-13 — End: 2019-01-14
  Administered 2019-01-13: 22:00:00 3000 [IU] via INTRAVENOUS
  Filled 2019-01-13: qty 1

## 2019-01-13 NOTE — Plan of Care (Signed)
Problem: Moderate/High Fall Risk Score >5  Goal: Patient will remain free of falls  Outcome: Progressing  Flowsheets (Taken 01/13/2019 0846)  VH Moderate Risk (6-13): ALL REQUIRED LOW INTERVENTIONS;INITIATE YELLOW "FALL RISK" SIGNAGE;YELLOW NON-SKID SLIPPERS;YELLOW "FALL RISK" ARM BAND;USE OF BED EXIT ALARM IF PATIENT IS CONFUSED OR IMPULSIVE. PLACE RESET BED ALARM SIGN ABOVE BED;PLACE FALL RISK LEVEL ON WHITE BOARD FOR COMMUNICATION PURPOSES IN PATIENT'S ROOM     Problem: Hemodynamic Status: Cardiac  Goal: Stable vital signs and fluid balance  Outcome: Progressing  Flowsheets (Taken 01/13/2019 1102)  Stable vital signs and fluid balance: Monitor/assess vital signs and telemetry per unit protocol; Weigh on admission and record weight daily; Assess signs and symptoms associated with cardiac rhythm changes; Monitor lab values; Monitor for leg swelling/edema and report to LIP if abnormal; Monitor intake/output per unit protocol and/or LIP order     Problem: Impaired Mobility  Goal: Mobility/Activity is maintained at optimal level for patient  Outcome: Progressing  Flowsheets (Taken 01/13/2019 1102)  Mobility/activity is maintained at optimal level for patient: Increase mobility as tolerated/progressive mobility; Encourage independent activity per ability; Maintain proper body alignment; Perform active/passive ROM; Plan activities to conserve energy, plan rest periods; Reposition patient every 2 hours and as needed unless able to reposition self; Assess for changes in respiratory status, level of consciousness and/or development of fatigue

## 2019-01-13 NOTE — Plan of Care (Signed)
Problem: Moderate/High Fall Risk Score >5  Description: Fall Risk Score > 5  Goal: Patient will remain free of falls  Outcome: Progressing     Problem: Hemodynamic Status: Cardiac  Goal: Stable vital signs and fluid balance  Description: Interventions:  1. Monitor/assess vital signs and telemetry per unit protocol  2. Weigh on admission and record weight daily  3. Assess signs and symptoms associated with cardiac rhythm changes  4. Monitor intake/output per unit protocol and/or LIP order  5. Monitor lab values  6. Monitor for leg swelling/edema and report to LIP if abnormal  Outcome: Progressing     Problem: Ineffective Gas Exchange  Goal: Effective breathing pattern  Description: Interventions:  1. Maintain O2 saturation level per LIP order  2. Maintain CO2 level per LIP order  3. Monitor end tidal CO2 level per LIP order  4. Teach/reinforce use of ordered respiratory interventions (ie. CPAP, BiPAP, Incentive Spirometer, Acapella)  5. Monitor for sleep apnea  6. Monitor for medication induced respiratory depression  Outcome: Progressing     Problem: Nutrition  Goal: Nutritional intake is adequate  Description: Interventions:  1. Monitor daily weights  2. Assist patient with meals/food selection  3. Allow adequate time for meals  4. Encourage/perform oral hygiene as appropriate   5. Encourage/administer dietary supplements as ordered (i.e. tube feed, TPN, oral, OGT/NGT, supplements)  6. Consult/collaborate with Clinical Nutritionist  7. Include patient/patient care companion in decisions related to nutrition  8. Assess anorexia, appetite, and amount of meal/food tolerated  9. Consult/collaborate with Speech Therapy (swallow evaluations)  Outcome: Progressing     Problem: Impaired Mobility  Goal: Mobility/Activity is maintained at optimal level for patient  Description: Interventions:  1. Increase mobility as tolerated/progressive mobility  2. Encourage independent activity per ability  3. Maintain proper body  alignment  4. Perform active/passive ROM  5. Plan activities to conserve energy, plan rest periods  6. Reposition patient every 2 hours and as needed unless able to reposition self  7. Assess for changes in respiratory status, level of consciousness and/or development of fatigue  8. Consult/collaborate with Physical Therapy and/or Occupational Therapy  Outcome: Progressing

## 2019-01-13 NOTE — UM Notes (Signed)
[facility] Utilization Management Review Sheet    Facility :  [facility]    NAME: Trevor Fisher  MR#: [id number]    CSN#: [id number]    ROOM: 207/207-B AGE: 73 y.o.    ADMIT DATE AND TIME: 01/12/2019  1:52 AM      PATIENT CLASS: Observation status    ATTENDING PHYSICIAN: Wynonia Hazard, MD  PAYOR:Payor: MEDICARE / Plan: MEDICARE PART A AND B / Product Type: Medicare /       AUTH #:     DIAGNOSIS:     ICD-10-CM    1. Acute chest pain R07.9        HISTORY:   Past Medical History:   Diagnosis Date    Anxiety     Cerebrovascular accident     Diabetes mellitus     History of angina     Hyperlipidemia     Hypertension     Restless leg syndrome        DATE OF REVIEW: 01/13/2019    VITALS: BP 128/66    Pulse 61    Temp 97.7 F (36.5 C) (Oral)    Resp 16    Ht 1.854 m (6\' 1" )    Wt 100.1 kg (220 lb 10.9 oz)    SpO2 95%    BMI 29.12 kg/m     Active Hospital Problems    Diagnosis    Solitary pulmonary nodule on lung CT    Chest pain    HTN (hypertension)    HLD (hyperlipidemia)    CAD (coronary artery disease)    Anxiety and depression       UR NOTE:  Observation follow up  Observation status    Per MD progress note 5/3:  Subjective and Interval history:   " I feel fine now!"      A 74 year old gentleman with a past medical history of coronary artery disease status post multiple interventions, gives a history of exertional shortness of breath, nausea episodes of sharp chest pains  Which has some typical and some atypical features      Assessment/Plan          Active Hospital Problems    Diagnosis    Solitary pulmonary nodule on lung CT    Chest pain    HTN (hypertension)    HLD (hyperlipidemia)    CAD (coronary artery disease)    Anxiety and depression       #1 coronary artery disease , chest pain, the history is somewhat inconsistent and tangential   exertional nausea and shortness of breath, episodes of chest pain, at times happened during rest,  None today , cardiology consulted    I  believe the patient will need a stress test versus left heart cath  I will discuss with cardiology  In the meantime, continue aspirin, statins, antianginals  Somewhat bradycardic, no beta-blockers  Will request records    #2 hypertension, blood pressure acceptable, continue current management    #3 anxiety, will use PRN benzodiazepines while hospitalized    #4 abnormal normal CT scan,  pleural-based nodules the patient has, had no upper respiratory tract symptoms, has been afebrile  Apparently has a history of asbestosis,   No concern for any infectious process at this time  Will require a repeat CT scan      OBS order  Referred to [facility]    Please call RN with any questions  Phone [phone]  Fax [phone]  Thank you

## 2019-01-13 NOTE — Plan of Care (Signed)
Problem: Moderate/High Fall Risk Score >5  Description  Fall Risk Score > 5  Goal: Patient will remain free of falls  Outcome: Progressing     Problem: Hemodynamic Status: Cardiac  Goal: Stable vital signs and fluid balance  Description  Interventions:  1. Monitor/assess vital signs and telemetry per unit protocol  2. Weigh on admission and record weight daily  3. Assess signs and symptoms associated with cardiac rhythm changes  4. Monitor intake/output per unit protocol and/or LIP order  5. Monitor lab values  6. Monitor for leg swelling/edema and report to LIP if abnormal     Problem: Ineffective Gas Exchange  Goal: Effective breathing pattern  Description  Interventions:  1. Maintain O2 saturation level per LIP order  2. Maintain CO2 level per LIP order  3. Monitor end tidal CO2 level per LIP order  4. Teach/reinforce use of ordered respiratory interventions (ie. CPAP, BiPAP, Incentive Spirometer, Acapella)  5. Monitor for sleep apnea  6. Monitor for medication induced respiratory depression  Outcome: Progressing     Problem: Nutrition  Goal: Nutritional intake is adequate  Description  Interventions:  1. Monitor daily weights  2. Assist patient with meals/food selection  3. Allow adequate time for meals  4. Encourage/perform oral hygiene as appropriate   5. Encourage/administer dietary supplements as ordered (i.e. tube feed, TPN, oral, OGT/NGT, supplements)  6. Consult/collaborate with Clinical Nutritionist  7. Include patient/patient care companion in decisions related to nutrition  8. Assess anorexia, appetite, and amount of meal/food tolerated  9. Consult/collaborate with Speech Therapy (swallow evaluations)  Outcome: Progressing     Problem: Impaired Mobility  Goal: Mobility/Activity is maintained at optimal level for patient  Description  Interventions:  1. Increase mobility as tolerated/progressive mobility  2. Encourage independent activity per ability  3. Maintain proper body alignment  4. Perform  active/passive ROM  5. Plan activities to conserve energy, plan rest periods  6. Reposition patient every 2 hours and as needed unless able to reposition self  7. Assess for changes in respiratory status, level of consciousness and/or development of fatigue  8. Consult/collaborate with Physical Therapy and/or Occupational Therapy  Outcome: Progressing

## 2019-01-13 NOTE — Progress Notes (Signed)
Received report from previous nurse Coy Saunas. Patient sitting up in bed watching tv. Patient on telemetry and denies needs. Will continue to monitor.

## 2019-01-13 NOTE — Progress Notes (Signed)
PROGRESS NOTE    Date Time: 01/13/19 9:32 AM  Patient Name: Trevor Fisher, Trevor Fisher      Subjective and Interval history:   " I feel fine now!"      A 73 year old gentleman with a past medical history of coronary artery disease status post multiple interventions, gives a history of exertional shortness of breath, nausea episodes of sharp chest pains  Which has some typical and some atypical features    Medications:     Current Facility-Administered Medications   Medication Dose Route Frequency    aspirin  81 mg Oral Daily    atorvastatin  80 mg Oral QHS    enoxaparin  40 mg Subcutaneous Q24H    hydrOXYzine  75 mg Oral QHS    isosorbide mononitrate  60 mg Oral Daily    lactobacillus species  50 Billion CFU Oral Daily    lisinopril  20 mg Oral Daily    ranolazine  1,000 mg Oral BID    sodium chloride (PF)  3 mL Intravenous Q8H    terazosin  1 mg Oral QHS    traZODone  100 mg Oral QHS     cyclobenzaprine, morphine, naloxone, ondansetron **OR** ondansetron        Review of Systems:     Constitutional:  feels well    Physical Exam:     Vitals:    01/13/19 0842   BP: 128/66   Pulse: 61   Resp: 16   Temp: 97.7 F (36.5 C)   SpO2: 95%       Intake and Output Summary (Last 24 hours) at Date Time    Intake/Output Summary (Last 24 hours) at 01/13/2019 0932  Last data filed at 01/13/2019 1610  Gross per 24 hour   Intake 1658.33 ml   Output 2725 ml   Net -1066.67 ml       General appearance - alert, well appearing, and in no distress  Mental status - alert, oriented to person, place, and time  Mouth - mucous membranes moist, pharynx normal without lesions  Chest - clear to auscultation, no wheezes, rales or rhonchi, symmetric air entry  Heart - normal rate, regular rhythm, normal S1, S2, no murmurs, rubs, clicks or gallops  Abdomen - soft, nontender, nondistended, no masses or organomegaly  Neurological - alert, oriented, normal speech, no focal findings or movement disorder noted  Musculoskeletal - no joint tenderness, deformity  or swelling  Extremities - peripheral pulses normal, no pedal edema, no clubbing or cyanosis  Skin - normal coloration and turgor, no rashes, no suspicious skin lesions noted    Labs reviewed during this encounter :     Results     Procedure Component Value Units Date/Time    Comprehensive metabolic panel [[id number]]  (Abnormal) Collected:  01/13/19 0542    Specimen:  Plasma Updated:  01/13/19 0702     Sodium 136 mMol/L      Potassium 4.2 mMol/L      Chloride 104 mMol/L      CO2 25.0 mMol/L      Calcium 8.5 mg/dL      Glucose 119 mg/dL      Creatinine 1.47 mg/dL      BUN 13 mg/dL      Protein, Total 6.0 gm/dL      Albumin 3.6 gm/dL      Alkaline Phosphatase 67 U/L      ALT 15 U/L      AST (SGOT) 15 U/L  Bilirubin, Total 0.7 mg/dL      Albumin/Globulin Ratio 1.50 Ratio      Anion Gap 11.2 mMol/L      BUN/Creatinine Ratio 11.8 Ratio      EGFR 67 mL/min/1.59m2      Osmolality Calculated 274 mOsm/kg      Globulin 2.4 gm/dL     CBC and differential [[id number]] Collected:  01/13/19 0542    Specimen:  Blood Updated:  01/13/19 0647     WBC 7.0 K/cmm      RBC 4.52 M/cmm      Hemoglobin 14.4 gm/dL      Hematocrit 78.2 %      MCV 91 fL      MCH 32 pg      MCHC 35 gm/dL      RDW 95.6 %      PLT CT 144 K/cmm      MPV 8.8 fL      NEUTROPHIL % 68.8 %      Lymphocytes 18.2 %      Monocytes 9.0 %      Eosinophils % 3.8 %      Basophils % 0.2 %      Neutrophils Absolute 4.8 K/cmm      Lymphocytes Absolute 1.3 K/cmm      Monocytes Absolute 0.6 K/cmm      Eosinophils Absolute 0.3 K/cmm      BASO Absolute 0.0 K/cmm     C Reactive Protein [[id number]]  (Abnormal) Collected:  01/12/19 0455    Specimen:  Plasma Updated:  01/12/19 1206     C-Reactive Protein 1.10 mg/dL             Rads:   Radiological Procedure reviewed.    No results found.      Assessment/Plan      Active Hospital Problems    Diagnosis    Solitary pulmonary nodule on lung CT    Chest pain    HTN (hypertension)    HLD (hyperlipidemia)    CAD (coronary artery  disease)    Anxiety and depression       #1 coronary artery disease , chest pain, the history is somewhat inconsistent and tangential   exertional nausea and shortness of breath, episodes of chest pain, at times happened during rest,  None today , cardiology consulted    I believe the patient will need a stress test versus left heart cath  I will discuss with cardiology  In the meantime, continue aspirin, statins, antianginals  Somewhat bradycardic, no beta-blockers  Will request records    #2 hypertension, blood pressure acceptable, continue current management    #3 anxiety, will use PRN benzodiazepines while hospitalized    #4 abnormal normal CT scan,  pleural-based nodules the patient has, had no upper respiratory tract symptoms, has been afebrile  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

## 2019-01-13 NOTE — Progress Notes (Signed)
Received report from previous nurse Coy Saunas. Patient resting in bed watching tv. Patient denies needs or pain at this time. Heparin drip infusing at 1000 units per hour. Bed alarm on. Call bell in reach. Will continue to monitor.

## 2019-01-14 ENCOUNTER — Observation Stay: Payer: Medicare Other

## 2019-01-14 ENCOUNTER — Observation Stay (HOSPITAL_BASED_OUTPATIENT_CLINIC_OR_DEPARTMENT_OTHER): Payer: Medicare Other

## 2019-01-14 DIAGNOSIS — I10 Essential (primary) hypertension: Secondary | ICD-10-CM

## 2019-01-14 DIAGNOSIS — R079 Chest pain, unspecified: Secondary | ICD-10-CM

## 2019-01-14 DIAGNOSIS — I2510 Atherosclerotic heart disease of native coronary artery without angina pectoris: Secondary | ICD-10-CM

## 2019-01-14 DIAGNOSIS — R0789 Other chest pain: Secondary | ICD-10-CM

## 2019-01-14 DIAGNOSIS — J929 Pleural plaque without asbestos: Secondary | ICD-10-CM

## 2019-01-14 LAB — APTT
aPTT: 45.8 s — ABNORMAL HIGH (ref 22.0–33.0)
aPTT: 47.8 s — ABNORMAL HIGH (ref 22.0–33.0)

## 2019-01-14 MED ORDER — TECHNETIUM TC 99M SESTAMIBI - CARDIOLITE
30.60 | Freq: Once | Status: AC | PRN
Start: 2019-01-14 — End: 2019-01-14
  Administered 2019-01-14: 12:00:00 31 via INTRAVENOUS

## 2019-01-14 MED ORDER — REGADENOSON 0.4 MG/5ML IV SOLN
0.40 mg | Freq: Once | INTRAVENOUS | Status: AC
Start: 2019-01-14 — End: 2019-01-14
  Administered 2019-01-14: 12:00:00 0.4 mg via INTRAVENOUS
  Filled 2019-01-14: qty 5

## 2019-01-14 MED ORDER — TECHNETIUM TC 99M SESTAMIBI - CARDIOLITE
10.20 | Freq: Once | Status: AC | PRN
Start: 2019-01-14 — End: 2019-01-14
  Administered 2019-01-14: 10:00:00 10 via INTRAVENOUS

## 2019-01-14 NOTE — Plan of Care (Signed)
Problem: Moderate/High Fall Risk Score >5  Goal: Patient will remain free of falls  Outcome: Progressing  Flowsheets (Taken 01/14/2019 0902)  VH Moderate Risk (6-13): YELLOW NON-SKID SLIPPERS; YELLOW "FALL RISK" ARM BAND; PLACE FALL RISK LEVEL ON WHITE BOARD FOR COMMUNICATION PURPOSES IN PATIENT'S ROOM; Use of "STOP ask for help" sign     Problem: Hemodynamic Status: Cardiac  Goal: Stable vital signs and fluid balance  Outcome: Progressing  Flowsheets (Taken 01/13/2019 1102 by Morey Hummingbird, RN)  Stable vital signs and fluid balance: Monitor/assess vital signs and telemetry per unit protocol;Weigh on admission and record weight daily;Assess signs and symptoms associated with cardiac rhythm changes;Monitor lab values;Monitor for leg swelling/edema and report to LIP if abnormal;Monitor intake/output per unit protocol and/or LIP order     Problem: Ineffective Gas Exchange  Goal: Effective breathing pattern  Outcome: Progressing  Flowsheets (Taken 01/13/2019 0313 by Isla Pence, RN)  Effective breathing pattern: Maintain O2 saturation level per LIP order;Monitor end tidal CO2 level per LIP order;Monitor for sleep apnea     Problem: Nutrition  Goal: Nutritional intake is adequate  Outcome: Progressing  Flowsheets (Taken 01/13/2019 0313 by Isla Pence, RN)  Nutritional intake is adequate: Monitor daily weights;Allow adequate time for meals;Encourage/administer dietary supplements as ordered (i.e. tube feed, TPN, oral, OGT/NGT, supplements)

## 2019-01-14 NOTE — Consults (Signed)
CONSULTATION    Date Time: 01/14/19 9:25 AM  Patient Name: Trevor Fisher, Trevor Fisher  MRN#: [id number]  DOB: 1946-09-04  Requesting Physician: Wynonia Hazard, MD      Reason for Consultation:   Chest pain    History:   Genene Churn. Nepomuceno is a 73 year old man who was in the area visiting his son.  He lives in [place].  Two days ago while driving he had "severe" chest pain described as well localized areas of "pushing "in the mammary regions on both sides.  This lasted about 30 minutes with associated nausea.   He normally notes chest pain to some extent throughout the day but this was more severe.  He is "always "short of breath."  He sometimes awakens at night due to dyspnea.  No edema.  He has frequent dizziness but denies syncope.  He has ruled out for myocardial infarction and there is no electrocardiographic evidence of myocardial ischemia.    He does have a history of coronary artery disease.  He reportedly presented with unstable angina pectoris and underwent cardiac catheterization elsewhere in April, 2016 and a stent was placed in the proximal LAD.  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  Long-acting nitrates and ranolazine were initiated at that time.  He presented to the [facility] emergency department about a month later with chest pain, ruled out for myocardial infarction, and was discharged from the ED.    Past Medical History:     1.  Type 2 diabetes mellitus no pharmacologic therapy  2.  Hypertension  3.  Benign prostatic hypertrophy  4.  Anxiety disorder  5.  Depression  6.  Restless leg syndrome  7.  Dyslipidemia  8.  Coronary artery disease as noted above  9.  Multiple subclinical "strokes" brain imaging study performed for vertigo.      Past Surgical History:     Past Surgical History:   Procedure Laterality Date    CORONARY ANGIOPLASTY WITH STENT PLACEMENT         Family History:   Mother died at the  age of 80 from "brain aneurysm."   Father was a World War II veteran and died at the age of 65 from unclear cause.  He has 4 sisters all of whom apparently have an anxiety disorder.    Social History:     Social History     Socioeconomic History    Marital status: Married     Spouse name: Not on file    Number of children: Not on file    Years of education: Not on file    Highest education level: Not on file   Occupational History    Not on file   Social Needs    Financial resource strain: Not on file    Food insecurity:     Worry: Not on file     Inability: Not on file    Transportation needs:     Medical: Not on file     Non-medical: Not on file   Tobacco Use    Smoking status: Former Smoker    Smokeless tobacco: Never Used   Substance and Sexual Activity    Alcohol use: Not Currently     Frequency: Never     Comment: occ beer    Drug use: Never    Sexual activity: Not on file   Lifestyle    Physical activity:  Days per week: Not on file     Minutes per session: Not on file    Stress: Not on file   Relationships    Social connections:     Talks on phone: Not on file     Gets together: Not on file     Attends religious service: Not on file     Active member of club or organization: Not on file     Attends meetings of clubs or organizations: Not on file     Relationship status: Not on file    Intimate partner violence:     Fear of current or ex partner: Not on file     Emotionally abused: Not on file     Physically abused: Not on file     Forced sexual activity: Not on file   Other Topics Concern     Married.  Lives in [place] and is visiting his son.  Quit smoking in 2000 and previously smoked about a pack per day for 50 years.  Occasional alcohol use.  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    Social History Narrative    Not on file       Allergies:   No Known Allergies   No history of intravenous contrast reaction    Medications:     Current  Facility-Administered Medications   Medication Dose Route Frequency    aspirin  81 mg Oral Daily    atorvastatin  80 mg Oral QHS    hydrOXYzine  75 mg Oral QHS    isosorbide mononitrate  60 mg Oral Daily    lactobacillus species  50 Billion CFU Oral Daily    lisinopril  20 mg Oral Daily    ranolazine  1,000 mg Oral BID    sodium chloride (PF)  3 mL Intravenous Q8H    terazosin  1 mg Oral QHS    traZODone  100 mg Oral QHS       Current Discharge Medication List      CONTINUE these medications which have NOT CHANGED    Details   aspirin 81 MG chewable tablet Chew 81 mg by mouth daily      ATORVASTATIN CALCIUM PO Take 80 mg by mouth      cyanocobalamin 1000 MCG tablet Take 1,000 mcg by mouth daily      cyclobenzaprine (FLEXERIL) 5 MG tablet Take 5 mg by mouth 3 (three) times daily as needed for Muscle spasms      hydrOXYzine (VISTARIL) 100 MG capsule Take 100 mg by mouth 3 (three) times daily as needed      isosorbide mononitrate (ISMO,MONOKET) 20 MG tablet Take 60 mg by mouth daily         LACTOBACILLUS PO Take by mouth      lisinopril (PRINIVIL,ZESTRIL) 20 MG tablet Take 20 mg by mouth daily      nitroglycerin (NITROSTAT) 0.4 MG SL tablet Place 0.4 mg under the tongue every 5 (five) minutes as needed for Chest pain      pantoprazole (PROTONIX) 40 MG tablet Take 40 mg by mouth daily      PRAMIPEXOLE DIHYDROCHLORIDE ER PO Take 0.25 mg by mouth      ranolazine (RANEXA) 500 MG 12 hr tablet Take 1,000 mg by mouth 2 (two) times daily      terazosin (HYTRIN) 1 MG capsule Take 1 mg by mouth nightly      traZODone (DESYREL) 100 MG  tablet Take 100 mg by mouth nightly             Review of Systems:   ROS  Review of Systems - General ROS: negative for - chills or fever  Psychological ROS: Depression and anxiety disorder  Hematological and Lymphatic ROS: negative for - bleeding problems or blood clots  Endocrine ROS: negative  Respiratory ROS: Notes minimal cough; no sputum production or hemoptysis.  Cardiovascular ROS:  see HPI  Gastrointestinal ROS: Frequent heartburn; notes black stools at times  Genito-Urinary ROS: Obstructive symptoms due to BPH but much improved on terazosin; no nocturia at present  Neurological ROS: No recent TIA symptoms  Physical Exam:       Tmax: Temp (24hrs), Avg:97.7 F (36.5 C), Min:97.3 F (36.3 C), Max:97.9 F (36.6 C)     BP: 129/67   Heart Rate: 60   SpO2: 93 %   100.1 kg (220 lb 10.9 oz)         Intake/Output Summary (Last 24 hours) at 01/14/2019 0925  Last data filed at 01/14/2019 0840  Gross per 24 hour   Intake 1551.67 ml   Output 1750 ml   Net -198.33 ml       General appearance - alert, well appearing, and in no distress  Head - Normocephalic, atraumatic  Eyes -no icterus or xanthelasmas  Neck - supple, no significant adenopathy, no thyromegaly, no masses; JVP normal.  No carotid bruits  Chest - clear to auscultation, no wheezes, rales or rhonchi  Heart -regular rhythm with normal S1 and S2.  No S3, murmur, or pericardial friction rub.  Abdomen - soft, nontender, nondistended, no masses or organomegaly; no bruits  Neurological - alert, oriented, moves all extremities well.  Extremities -  no pedal edema, no clubbing or cyanosis; radial, femoral, and posterior tibial pulses 2+ bilaterally; dorsalis pedis pulses are not appreciated; no femoral bruits  Skin - normal coloration and turgor, no rashes    Labs Reviewed:     Recent Labs   Lab 01/13/19  0542 01/12/19  0214   Glucose 130* 146*   BUN 13 12   Creatinine 1.10 1.10   Sodium 136 136   Potassium 4.2 3.8   Chloride 104 104   CO2 25.0 22.0   AST (SGOT) 15 17   ALT 15 18             Recent Labs   Lab 01/13/19  1400 01/13/19  0542 01/12/19  0214   WBC  --  7.0 5.9   Hemoglobin  --  14.4 14.9   Hematocrit  --  41.2 43.8   PLT CT  --  144 185   PT INR 1.1  --   --        Recent Labs   Lab 01/13/19  1400 01/12/19  0750 01/12/19  0455   Troponin I 0.00 0.01 0.02             No results found for: [id number]     Recent Labs   Lab 01/13/19  1400   PT  INR 1.1     Procalcitonin 0.05; CRP 1.1; d-dimer 0.9.    Portable chest x-ray demonstrates cardiomegaly; no infiltrate, effusion, or evidence of congestive heart failure; no pneumothorax.    EKG demonstrates sinus rhythm with minimal nonspecific precordial T wave flattening.   Two subsequent EKGs are unchanged.    CHEST CTA:  1. No pulmonary embolism.  2. Mild bilateral peribronchial thickening and groundglass  opacities favoring infection/inflammation. Imaging features are atypical or uncommonly reported for viral pneumonia. Alternative diagnoses should be considered.  4. Bilateral scattered noncalcified pleural thickening. Follow-up CT in 6 months is recommended.    Assessment / Recommendation:      ASSESSMENT:  73 year old man with history of coronary artery disease with previous stenting of the proximal LAD in 2016. 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  Long-acting nitrates and ranolazine were initiated.  He has had chronic chest pain and presents with acute exacerbation.  Symptoms are not consistent with angina pectoris and suspect a musculoskeletal component.  No evidence of pulmonary embolus by chest CTA.  In light of history of coronary artery disease and symptoms which are likely not anginal in nature, he was offered discharge without further evaluation and subsequent outpatient follow-up, stress testing, cardiac catheterization.   He prefers to proceed with stress testing.  He does not think that he could ambulate satisfactorily on a treadmill and therefore pharmacologic stress testing will be pursued.        Signed by: Brock Bad, MD, [facility], [facility]  [facility].

## 2019-01-14 NOTE — Progress Notes (Signed)
[facility]  [address] [place], [place] [address]      Discharge Note         01/14/19 1541   Discharge Disposition   Patient preference/choice provided? N/A   Physical Discharge Disposition Home   Mode of Transportation Car   Patient/Family/POA notified of transfer plan Patient informed only   Patient agreeable to discharge plan/expected d/c date? Yes   Bedside nurse notified of transport plan? Yes   CM Interventions   Follow up appointment scheduled? Yes   Referral made for home health RN visit? Does not meet home bound criteria   Multidisciplinary rounds/family meeting before d/c? Yes   Medicare Checklist   Is this a Medicare patient? Yes   Patient received 1st IMM Letter? n/a         Clois Comber. Dalyla Chui, BSW  Social Worker  [facility]  [phone]

## 2019-01-14 NOTE — Progress Notes (Signed)
Readmission Risk  [facility] MED SURG   Patient Name: Trevor Fisher   Attending Physician: Wynonia Hazard, MD   Today's date:   01/14/2019 LOS: 0 days   Expected Discharge Date      Readmission Assessment:                                                              Discharge Planning  Care Coordination with Palliative Care: Not Available  Does the patient have perscription coverage?: Yes  Utilize [facility]: No  Confirmed PCP with Pt: Yes  Social Work Referral: Not Applicable  Anticipated Home Health at [place]: No  Anticipated Placement at [place]: No  CM Comments: 5/4 - SW - Chest pain - Stress Test negative - Home with no needs - Follow up with Cardiologist in [facility] -       Patient and wife are visiting from [place], [place] - He is a veteran and connected with the [place] in [place] - Patient sees S. Alvira Philips in [facility] for his cardiologist - SW set follow up appointment in 2 weeks with cardiologist in [place]    [place]. [address], [address] [place] [address] [phone]  - Fax [phone] or [phone]    SW attempted to meet with patient 3 x - He was unavailable - SW reviewed chart an spoke with wife - No discharge needs identified      IDPA:   Patient Type  Within 30 Days of Previous Admission?: No  Healthcare Decisions  Interviewed:: Patient  Orientation/Decision Estate manager/land agent of Patient: Alert and Oriented x3, able to make decisions  Advance Directive: Patient does not have advance directive  Advance Directive not in Chart: Copy requested from family/decision maker  Healthcare Agent Appointed: Yes  Prior to admission  Prior level of function: Independent with ADLs, Ambulates independently  Type of Residence: Private residence  Have running water, electricity, heat, etc?: Yes  Living Arrangements: Spouse/significant other  How do you get to your MD appointments?: Self  How do you get your groceries?: Self/Spouse  Who fixes your meals?: Self/Spouse  Who does  your laundry?: Self/Spouse  Who picks up your prescriptions?: Self/Spouse  Dressing: Independent  Grooming: Independent  Feeding: Independent  Bathing: Independent  Toileting: Independent  DME Currently at Home: CPAP/BIPAP  Adult Protective Services (APS) involved?: No  Discharge Planning  Support Systems: Spouse/significant other  Patient expects to be discharged to:: Home  Anticipated [place] plan discussed with:: Same as interviewed  Mode of transportation:: Private car (family member)  Does the patient have perscription coverage?: Yes  Consults/Providers  PT Evaluation Needed: No  OT Evalulation Needed: No  SLP Evaluation Needed: No  PCP  PCP on file was verified as the current PCP?: Provider not on file   30 Day Readmission:       Provider Notifications:          Clois Comber. Urbano Milhouse, BSW  Social Worker  [facility]  [phone]

## 2019-01-14 NOTE — Progress Notes (Signed)
Patient arrived to CV lab. Explained entire procedure. Consent read to pt. Patient gave verbal permission for test (COVID protocol). Breath sounds clear, no heart murmur noted. Prior to start of test, pt stated he had mid to right chest discomfort 1/10. Some point tenderness as well. He states he has daily chest pain.  Lexiscan stress test completed. Lexiscan 0.4mg  IV injected over 20 seconds.  No change in chest pain into recovery stage.  No ectopy.  Normal hemodynamics. No significant ST/T wave changes. Sestamibi images pending. Patient had been taken to waiting area. While there, stated he had increased chest discomfort, now a 3/10. No dyspnea or sweats or complaints of nausea. Patient taken back to his room for continued care. Primary nurse Salvadore Oxford RN notified.

## 2019-01-14 NOTE — Discharge Instr - Activity (Signed)
As tolerated

## 2019-01-14 NOTE — Progress Notes (Signed)
Called by NM tech to come check pt. He was on table of NM scanner having final cardiac imaging done. He stated he felt a little short of breath, chest discomfort 4/10. In NSR, no obvious ST changes on monitor. No ectopy. No obvious distress noted. Imaging completed and pt taken back to his room. Primary RN Salvadore Oxford made aware.

## 2019-01-14 NOTE — Discharge Summary (Signed)
DISCHARGE NOTE    Date Time: 01/14/19 3:14 PM  Patient Name: Trevor Fisher  Attending Physician: Wynonia Hazard, MD    Date of Admission:   01/12/2019    Date of Discharge:   01/14/2019        Problems:   Lists the present on admission hospital problems  Present on Admission:   Chest pain   HTN (hypertension)   HLD (hyperlipidemia)   CAD (coronary artery disease)   Anxiety and depression      Hospital Problems:  Principal Problem:    Chest pain  Active Problems:    HTN (hypertension)    HLD (hyperlipidemia)    CAD (coronary artery disease)    Anxiety and depression    Pleural thickening         Discharge Dx:   1 chest pain, ACS ruled out with serial enzymes  Low risk stress test  #2 coronary artery disease  #3 history of hypertension  #4 history of dyslipidemia  #5 pleural-based thickening on CT scan chest requiring outpatient follow-up          Hospital Course:    Please consult entire medical chart for details, briefly the patient is a 73 year old gentleman with a past medical history of coronary artery disease status post stenting presented with chest pain with some typical and some atypical features, CT chest ruled out pulmonary embolism, did show pleural thickening require outpatient follow-up, the patient  reports that she was exposed to agent orange during the [place] War and that his primary care physician is already following up on his chest imaging, troponin remained negative, cardiology felt that the patient can undergo a stress test, his stress test was fortunately unremarkable patient did describe episodes of central, chest discomfort, at times burning in nature is more consistent with GERD, the patient reports that he has a prescription of Protonix that he has not been using, I encouraged him to start using Protonix as prescribed and to follow-up closely with his PCP    Physical Exam:     Vitals:    01/14/19 1241   BP: 129/71   Pulse: 73   Resp: 20   Temp:    SpO2: 96%         General appearance -  alert, well appearing, and in no distress  Mental status - alert, oriented to person, place, and time  Mouth - mucous membranes moist, pharynx normal without lesions  Chest - clear to auscultation, no wheezes, rales or rhonchi, symmetric air entry  Heart - normal rate, regular rhythm, normal S1, S2, no murmurs, rubs, clicks or gallops  Abdomen - soft, nontender, nondistended, no masses or organomegaly  Musculoskeletal - no joint tenderness, deformity or swelling  Extremities - peripheral pulses normal, no pedal edema, no clubbing or cyanosis  Skin - normal coloration and turgor, no rashes, no suspicious skin lesions noted    Discharge Medications:     Current Discharge Medication List      CONTINUE these medications which have NOT CHANGED    Details   aspirin 81 MG chewable tablet Chew 81 mg by mouth daily      atorvastatin (LIPITOR) 80 MG tablet Take 80 mg by mouth daily         cyanocobalamin 1000 MCG tablet Take 1,000 mcg by mouth daily      cyclobenzaprine (FLEXERIL) 5 MG tablet Take 2.5 mg by mouth nightly         hydrOXYzine (VISTARIL) 25 MG capsule Take  100 mg by mouth 3 (three) times daily as needed for Anxiety         isosorbide mononitrate (IMDUR) 60 MG 24 hr tablet Take 60 mg by mouth daily      LACTOBACILLUS PO Take 1 capsule by mouth daily         lisinopril (PRINIVIL,ZESTRIL) 20 MG tablet Take 20 mg by mouth daily      nitroglycerin (NITROSTAT) 0.4 MG SL tablet Place 0.4 mg under the tongue every 5 (five) minutes as needed for Chest pain      pantoprazole (PROTONIX) 40 MG tablet Take 40 mg by mouth daily      pramipexole (MIRAPEX) 0.5 MG tablet Take 0.25 mg by mouth daily      ranolazine (RANEXA) 1000 MG SR tablet Take 1,000 mg by mouth 2 (two) times daily         sertraline (ZOLOFT) 100 MG tablet Take 150 mg by mouth daily      terazosin (HYTRIN) 1 MG capsule Take 1 mg by mouth nightly      traZODone (DESYREL) 50 MG tablet Take 50 mg by mouth nightly            STOP taking these medications        isosorbide mononitrate (ISMO,MONOKET) 20 MG tablet                Discharge Instructions:      Home    Follow-up with PCP in [place] in 1 week  Activity : As tolerated  Diet: heart Healthy  Consult entire medical chart for details, briefly the patient is a 73 year old gentleman with a past medical history of coronary artery disease status post stenting  Signed by: Shelva Majestic, MD

## 2019-01-14 NOTE — Discharge Instr - Diet (Signed)
Heart Healthy

## 2019-01-14 NOTE — Discharge Summary -  Nursing (Signed)
Discharge instructions reviewed with pt;pt verbalized an understanding.  Patient has all personal belongings on discharge.  Denies any needs.

## 2019-01-15 LAB — ECG 12-LEAD
P Wave Axis: 49 deg
P Wave Axis: 69 deg
P-R Interval: 136 ms
P-R Interval: 137 ms
Patient Age: 72 years
Patient Age: 72 years
Q-T Interval(Corrected): 421 ms
Q-T Interval(Corrected): 426 ms
Q-T Interval: 440 ms
Q-T Interval: 419 ms
QRS Axis: 1 deg
QRS Axis: 17 deg
QRS Duration: 100 ms
QRS Duration: 97 ms
T Axis: 76 years
T Axis: 88 years
Ventricular Rate: 55 //min
Ventricular Rate: 62 //min

## 2022-03-10 IMAGING — MR MRI LUMBAR SPINE W/WO CONTRAST
6 of 11 series · 17 of 48 positions shown · IV contrast (gadavist)
Comparison: None.

________________________________________________________________________________________________ 
MRI LUMBAR SPINE W/WO CONTRAST, 03/10/2022 [DATE]: 
CLINICAL INDICATION: Prior surgery. Low back pain with radiation down right leg 
for 8 months..
TECHNIQUE: Multiplanar, multiecho position MR images of the lumbar spine were 
performed without and with 10 mL of Gadavist were injected intravenously by 
hand. 0 mL of Gadavist were discarded. Patient was scanned on a 3T magnet.

[Series 201: survey · axial · 10.0mm · 1.39mm/px · 1 of 9 slices shown]
[im 1/9]
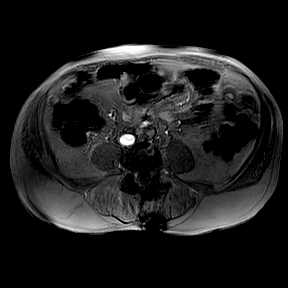

[Series 301: t2w_cor-surv · coronal · 6.0mm · 0.50mm/px · 1 of 5 slices shown]
[im 1/5]
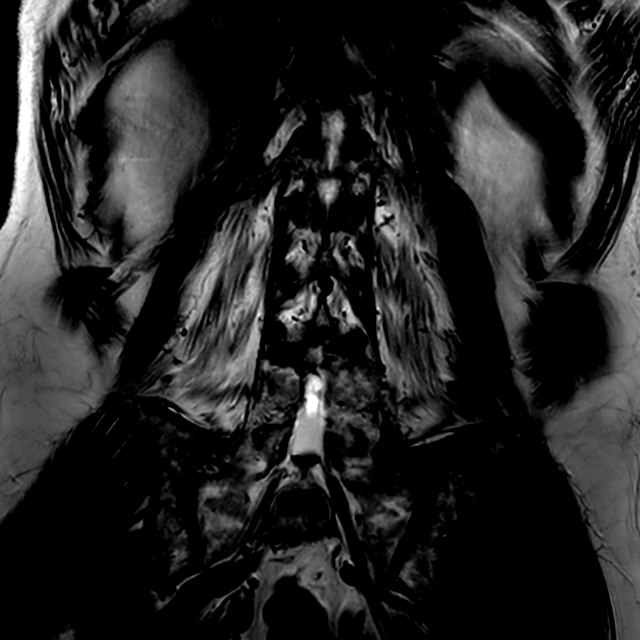

[Series 401: t2w_tse sag · sagittal · 4.0mm · 0.31mm/px · 1 of 17 slices shown]
[im 1/17]
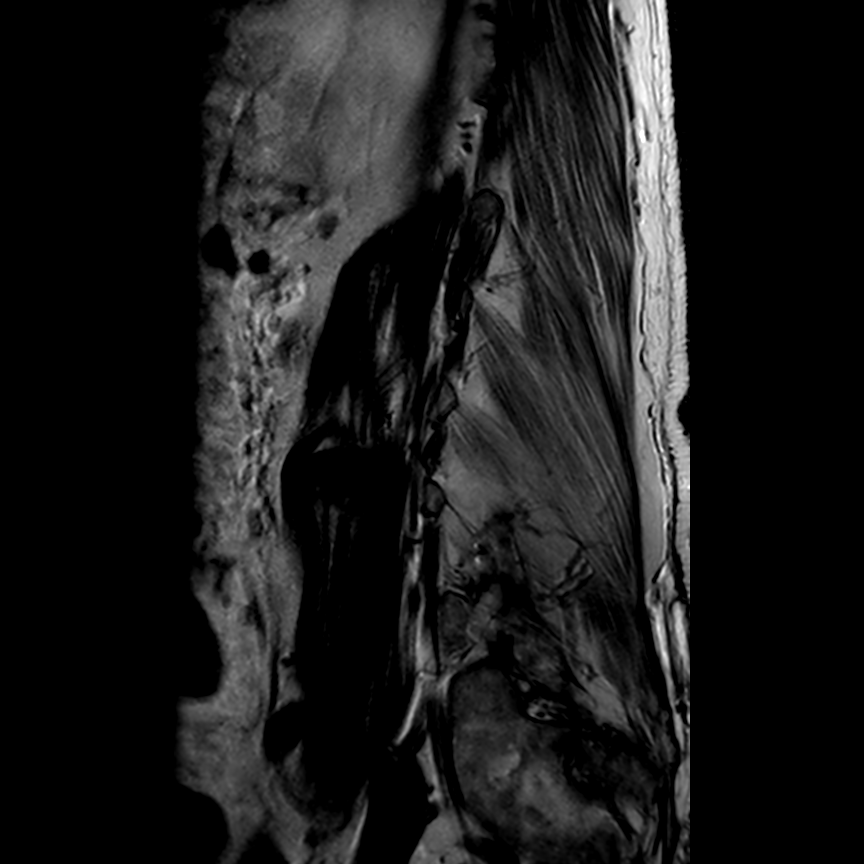

[Series 801: T1 · axial · 5.0mm · 0.35mm/px · z∈[-83,+112]mm · 6 of 25 slices shown (1 of 2)]
[im 1/25]
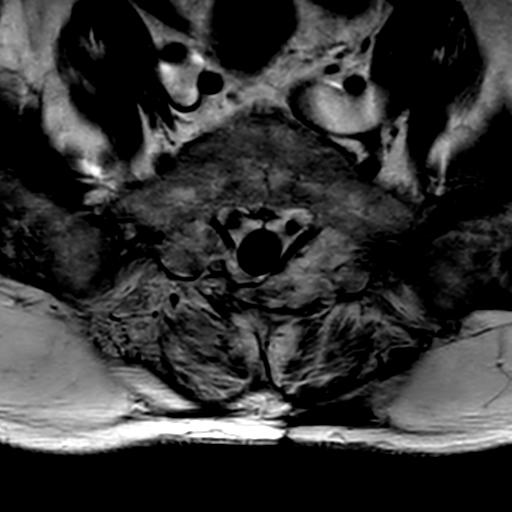
[im 5/25]
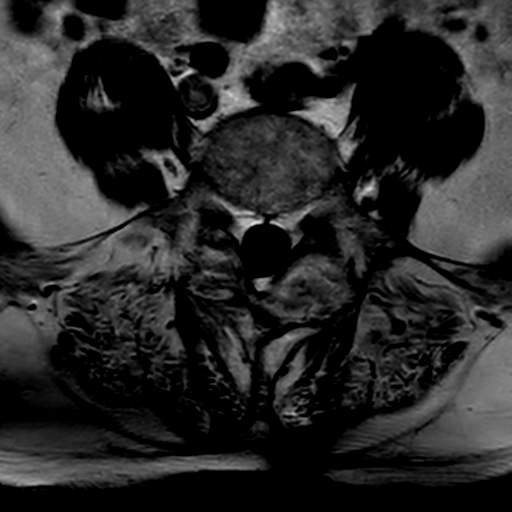
[im 10/25]
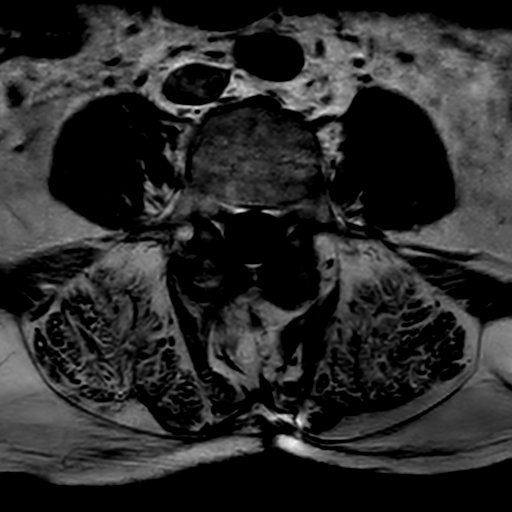
[im 15/25]
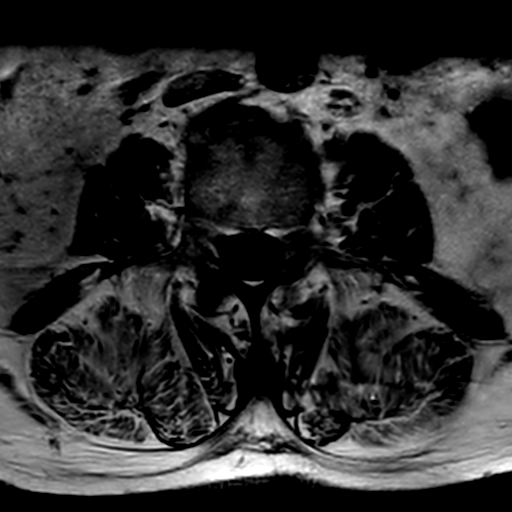
[im 20/25]
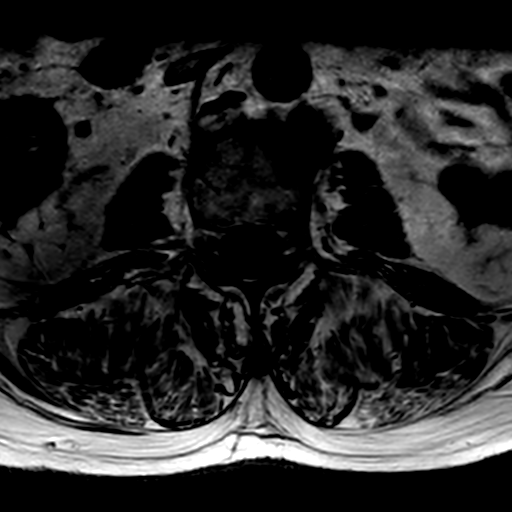
[im 25/25]
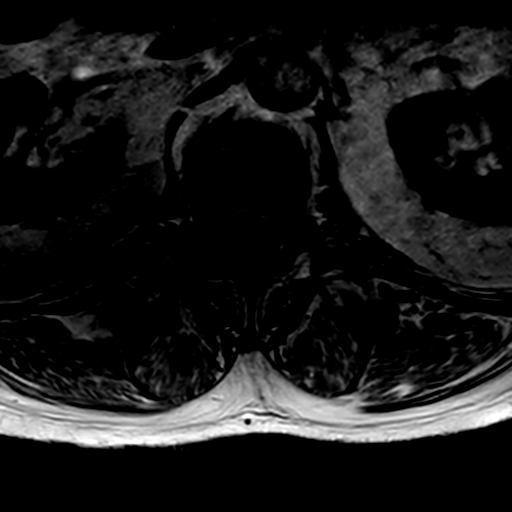

[Series 902: T1 fat-sat · sagittal · 4.0mm · 0.53mm/px · 4 of 17 slices shown]
[im 1/17]
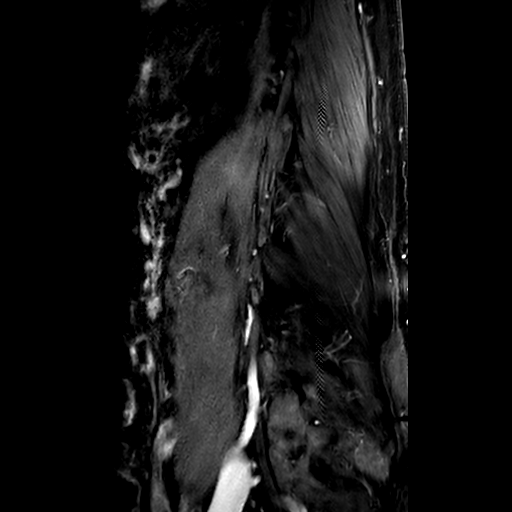
[im 6/17]
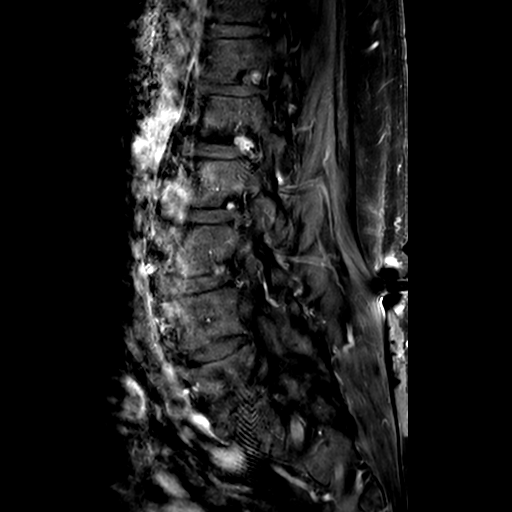
[im 11/17]
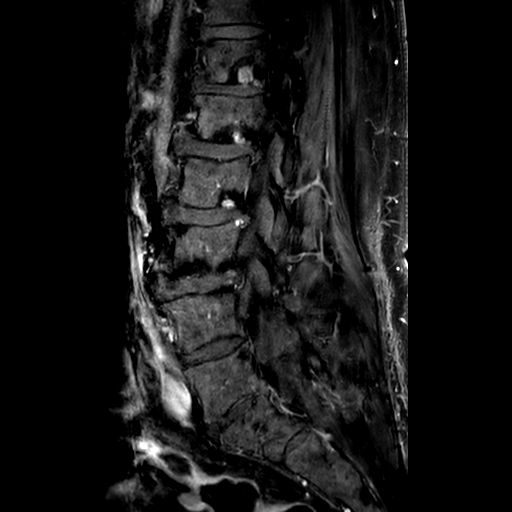
[im 17/17]
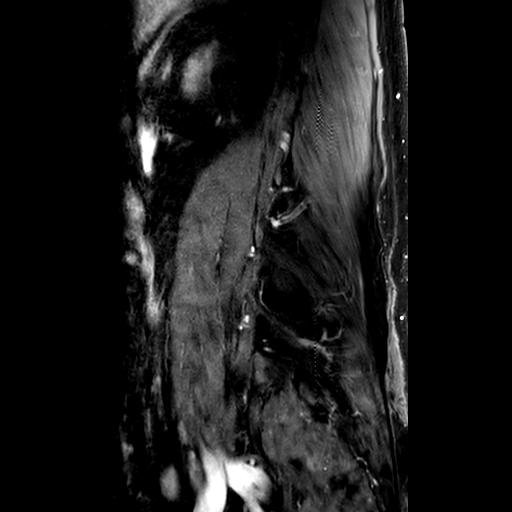

[Series 903: T1 · sagittal · 4.0mm · 0.53mm/px · 4 of 17 slices shown (2 of 2)]
[im 1/17]
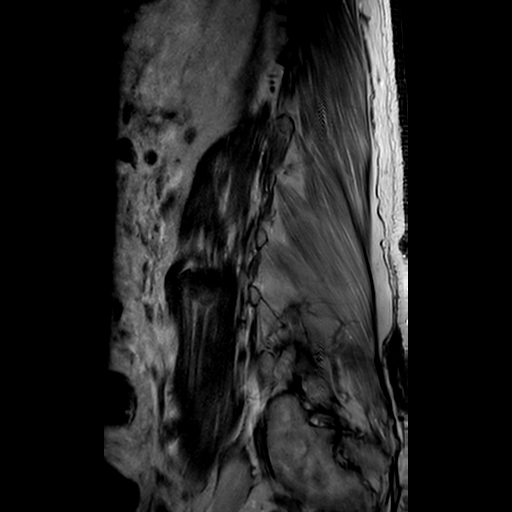
[im 6/17]
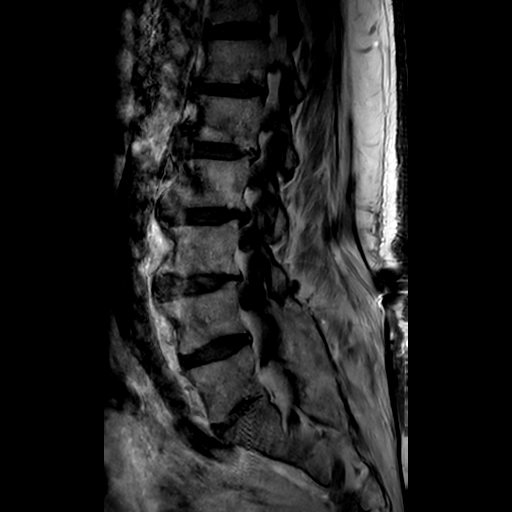
[im 11/17]
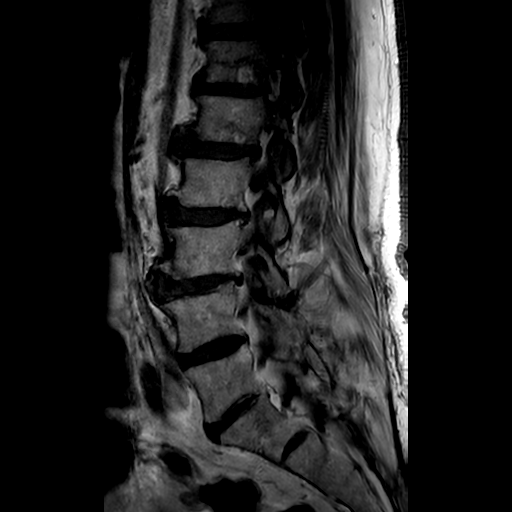
[im 17/17]
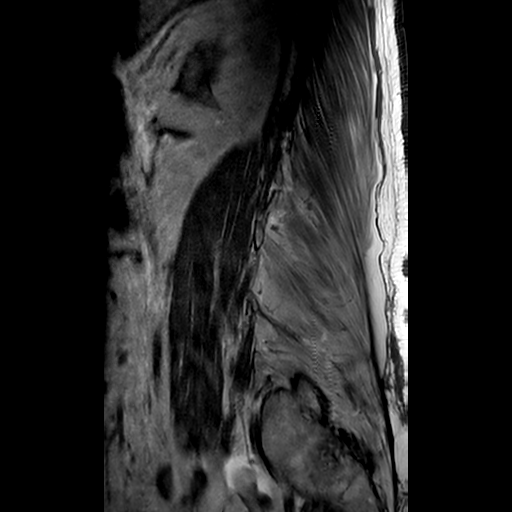

[17 of 48 positions shown; findings below may reference images not displayed]

FINDINGS: -------------------------------------------------------------------------------- 
------ 
GENERAL: 
Nomenclature is based on 5 lumbar type vertebral bodies.     
ALIGNMENT: Minimal retrolisthesis of T12 on L1. Mild retrolisthesis of L1 on L2, 
L2 on L3. Minimal retrolisthesis of L3 on L4. 
VERTEBRAL BODY HEIGHT: Normal.  
MARROW SIGNAL: Schmorls nodes along the inferior endplates of T12 and L1. Fatty 
endplate change, most predominant at L3-L4. 
CORD SIGNAL: Normal distal spinal cord and cauda equina. Conus medullaris 
terminates at L2-L3. No pathologic enhancement. 
ADDITIONAL FINDINGS: None. 
Modic I-II: L1-L2, L3-L4 
Ligamentum Flavum > 2.5 mm: All levels except L4-L5 and L5-S1 
-------------------------------------------------------------------------------- 
------ 
SEGMENTAL: 
L1-L2: Uncovering of the disc space with disc bulge. Mild central canal 
narrowing.  Mild right neural foraminal narrowing. Moderate left neural 
foraminal narrowing.  
L2-L3: Disc bulge. No significant central canal narrowing.  No significant right 
neural foraminal narrowing. Mild left neural foraminal narrowing.  
L3-L4: Uncovering of the disc space. Bilateral facet hypertrophy. No significant 
central canal narrowing.  Moderate right neural foraminal narrowing. Mild left 
neural foraminal narrowing.  
L4-L5: Laminectomy. Osseous fusion across the facet joints. No significant 
central canal narrowing.  No significant right neural foraminal narrowing. No 
significant left neural foraminal narrowing.  
L5-S1: Laminectomy. Osseous fusion across the facet joints. No significant 
central canal narrowing.  No significant right neural foraminal narrowing. No 
significant left neural foraminal narrowing.  

-------------------------------------------------------------------------------- 
------
IMPRESSION: 1.  Discogenic/degenerative as well as postsurgical changes as above. 
2.  No moderate or severe central canal narrowing at any level.  
3.  Worst level(s) of neural foraminal narrowing: L1-L2 (moderate left), L3-L4 
(moderate right)

## 2022-03-30 IMAGING — DX LUMBAR SPINE AP, LAT WITH FLEXION AND EXTEN
1 series · 4 of 4 positions shown · non-contrast
Comparison: MRI from February 2022

________________________________________________________________________________________________ 
LUMBAR SPINE AP, LAT WITH FLEXION AND EXTEN, 03/30/2022 [DATE]: 
CLINICAL INDICATION: Spondylolisthesis.

[Series 1: lateral · 0.14mm/px · 4 of 4 slices shown]
[im 1/4]
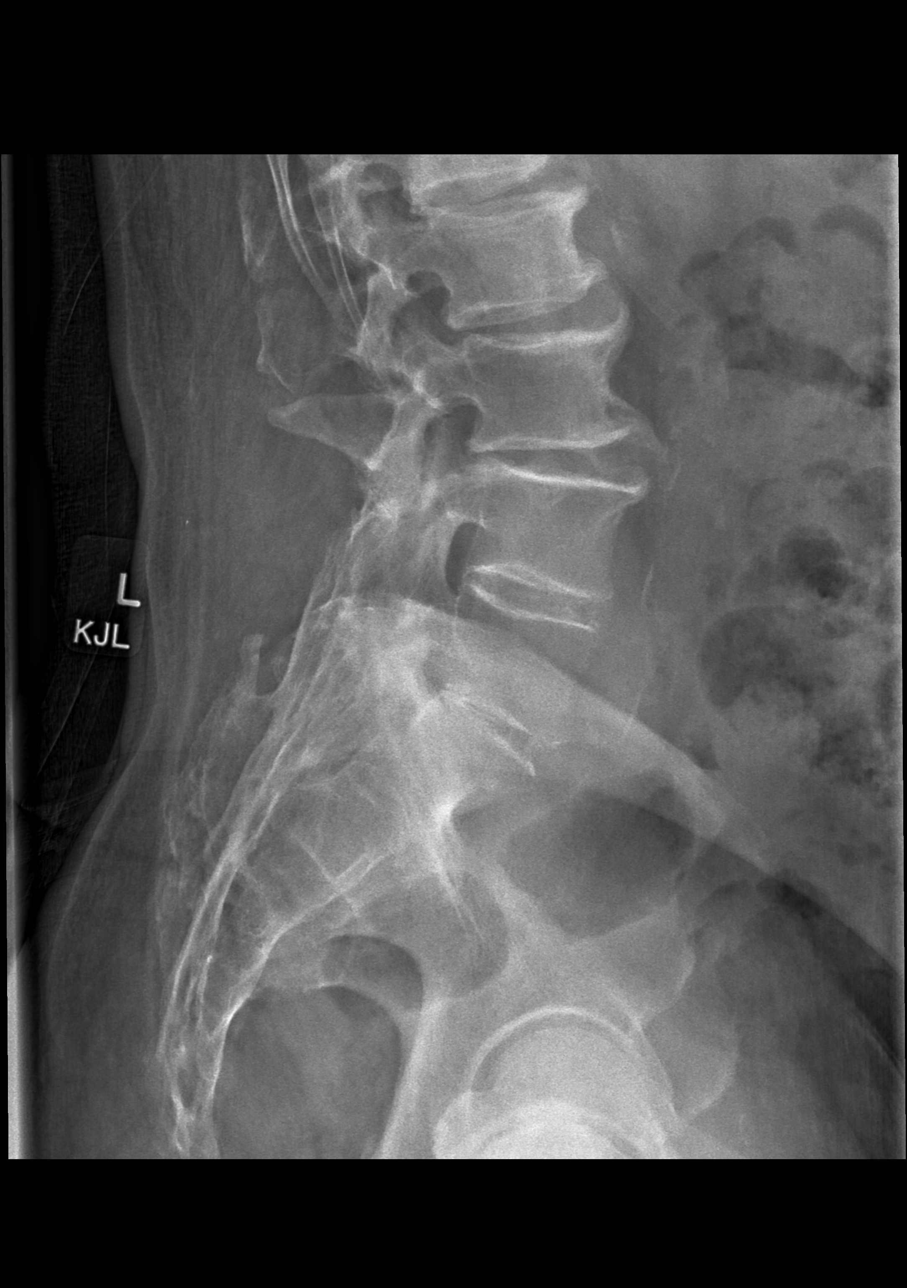
[im 2/4]
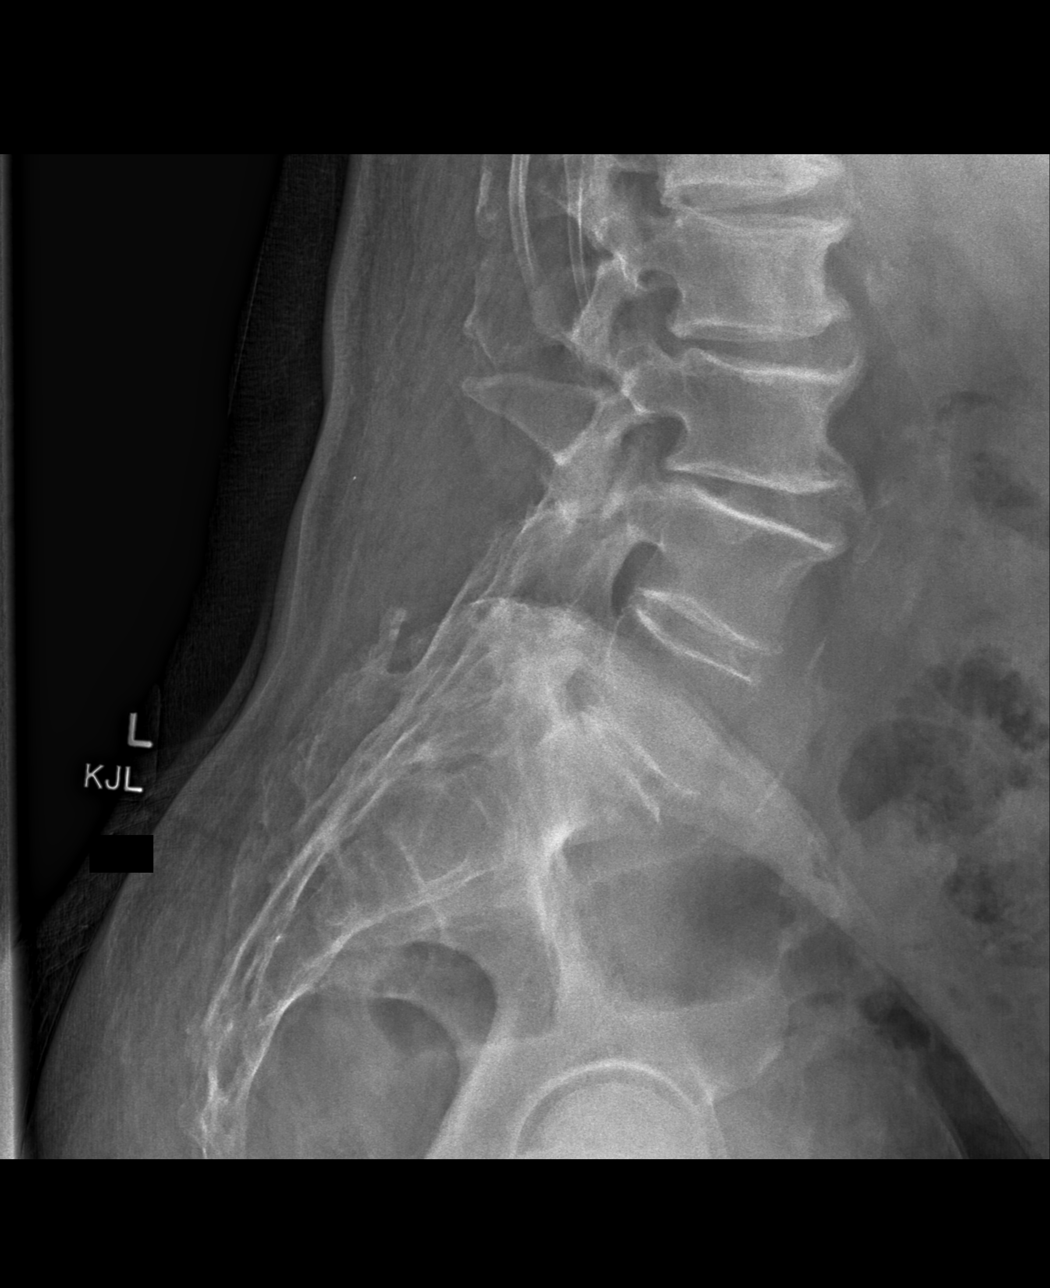
[im 3/4]
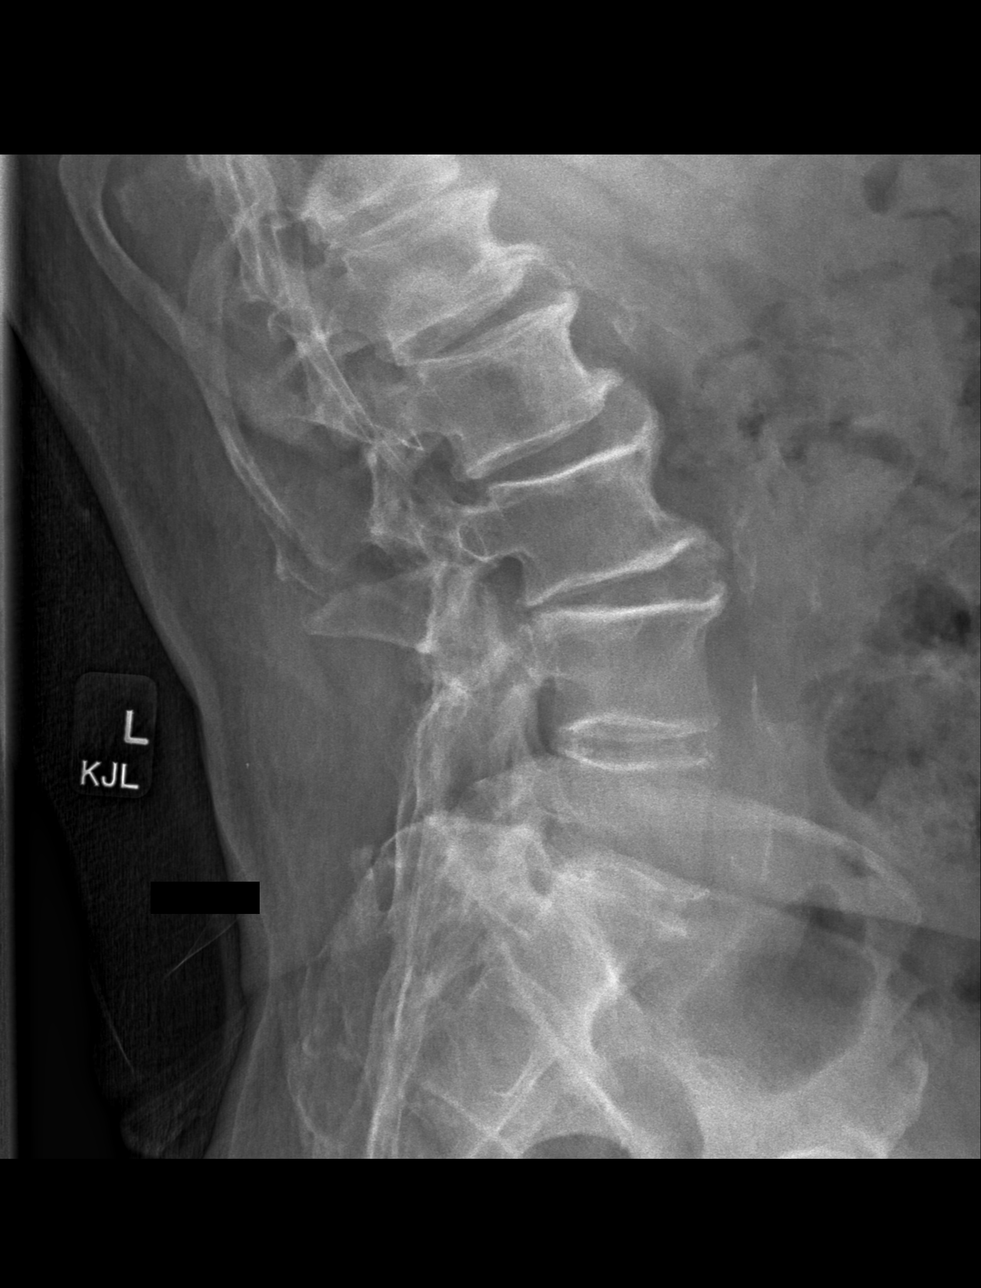
[im 4/4]
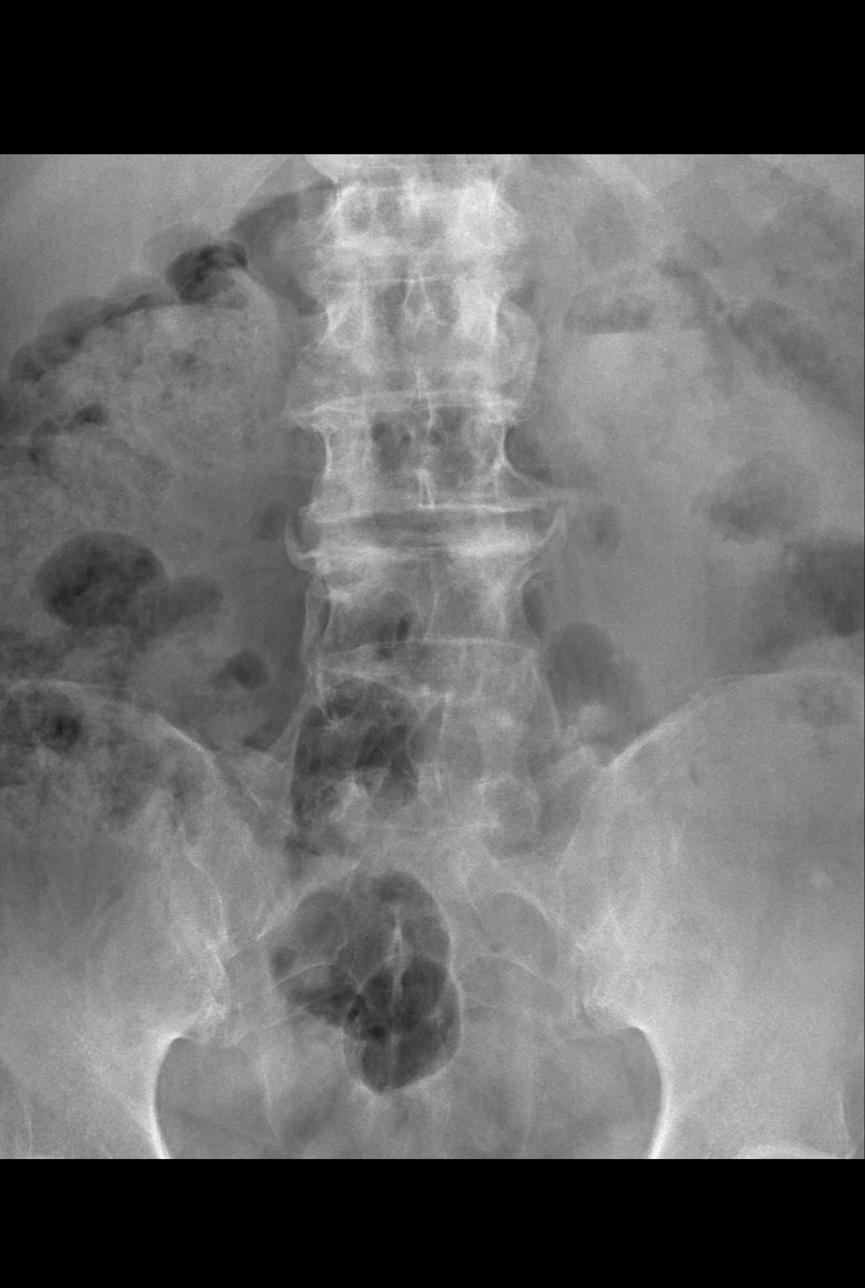

[4 of 4 positions shown; findings below may reference images not displayed]

FINDINGS: There is no fracture or subluxation. This includes evaluation with 
flexion-extension views. Postoperative changes at L5-S1 with posterior bony 
fusion. Endplate changes and bridging osteophytosis. Disc space heights are 
relatively well-maintained. Facet sclerosis seen inferiorly. Atherosclerotic 
changes are seen within the abdominal aorta.
IMPRESSION: Degenerative disc and facet disease without subluxation identified. 
Postoperative changes.

## 2023-02-01 IMAGING — MR MRI LUMBAR SPINE WITHOUT CONTRAST
6 of 8 series · 12 of 48 positions shown · IV contrast (gadolinium)
Comparison: Lumbar spine x-ray February 01, 2023 and March 30, 2022. MRI lumbar spine

________________________________________________________________________________________________ 
MRI LUMBAR SPINE WITHOUT CONTRAST, 02/01/2023 [DATE]: 
CLINICAL INDICATION: Chronic low back pain with right-sided radiculopathy.
TECHNIQUE: Multiplanar, multiecho position MR images of the lumbar spine were 
performed without intravenous gadolinium enhancement. Patient was scanned on a 
1.5T magnet

[Series 101: survey · axial · 10.0mm · 1.25mm/px · 1 of 10 slices shown]
[im 1/10]
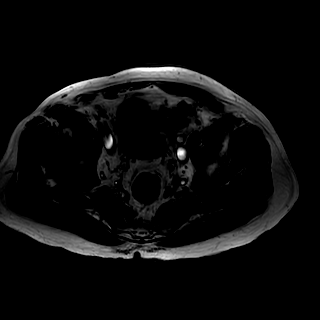

[Series 201: t2w_cor-surv · coronal · 6.0mm · 0.62mm/px · 1 of 14 slices shown]
[im 1/14]
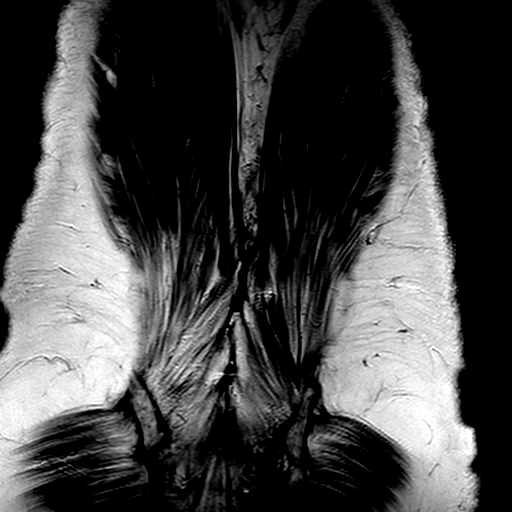

[Series 301: t1_tse_sag · sagittal · 4.0mm · 0.40mm/px · 3 of 19 slices shown]
[im 1/19]
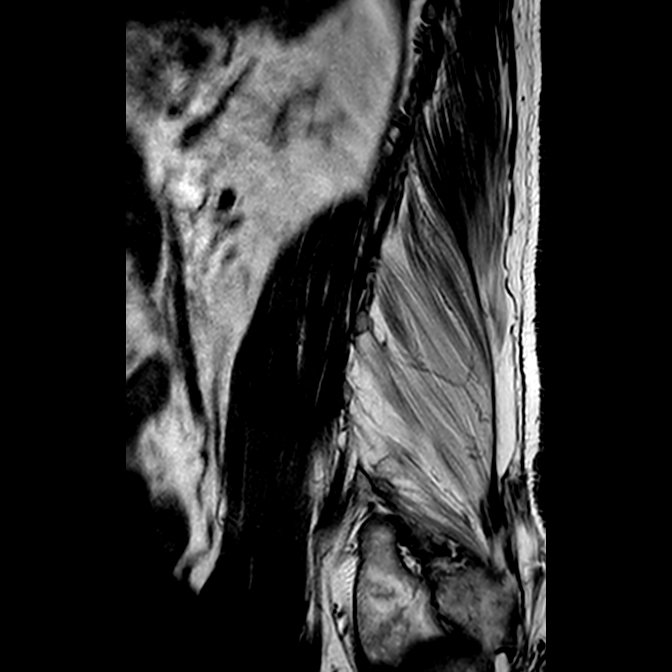
[im 10/19]
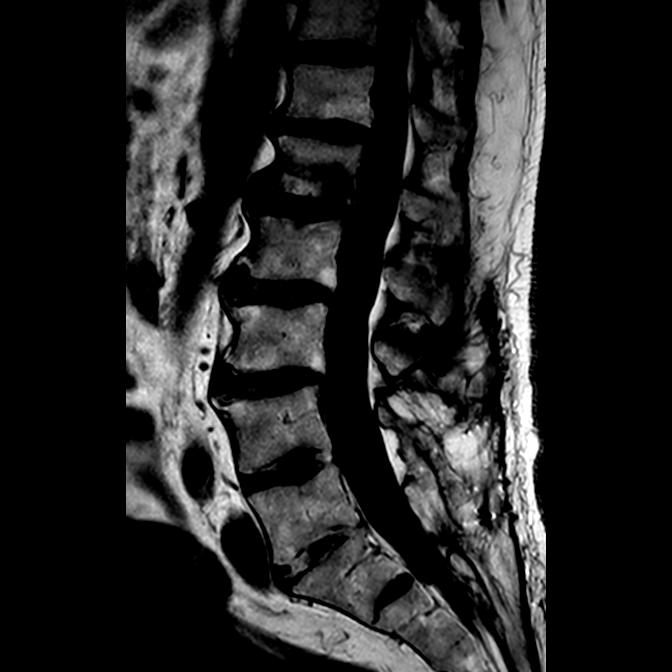
[im 19/19]
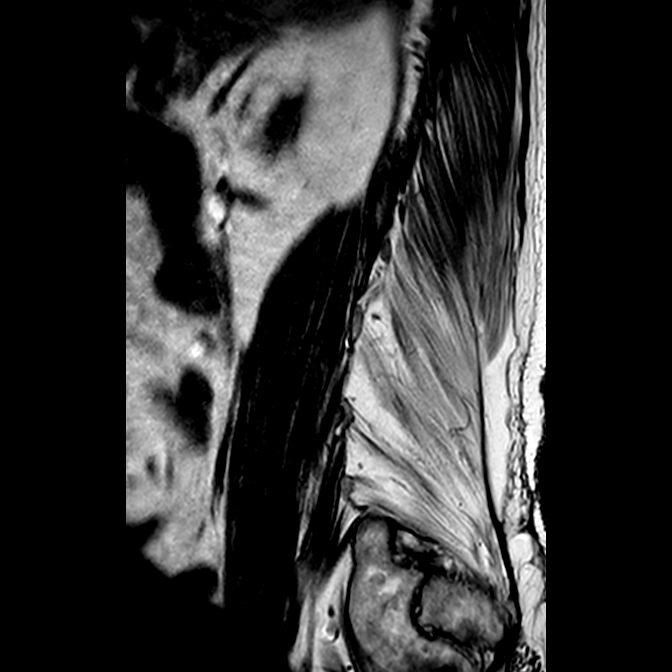

[Series 402: (id)_mdixon_tse · sagittal · 4.0mm · 0.47mm/px · 3 of 19 slices shown]
[im 1/19]
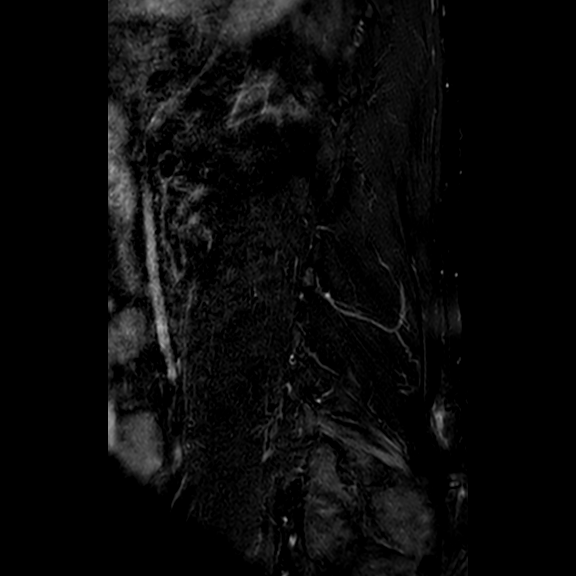
[im 10/19]
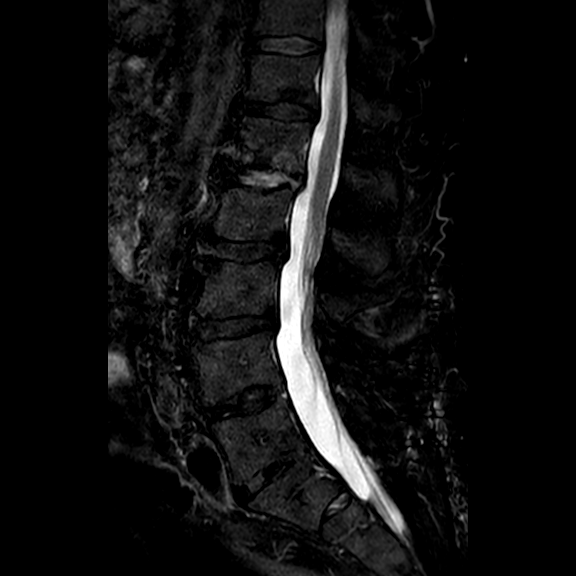
[im 19/19]
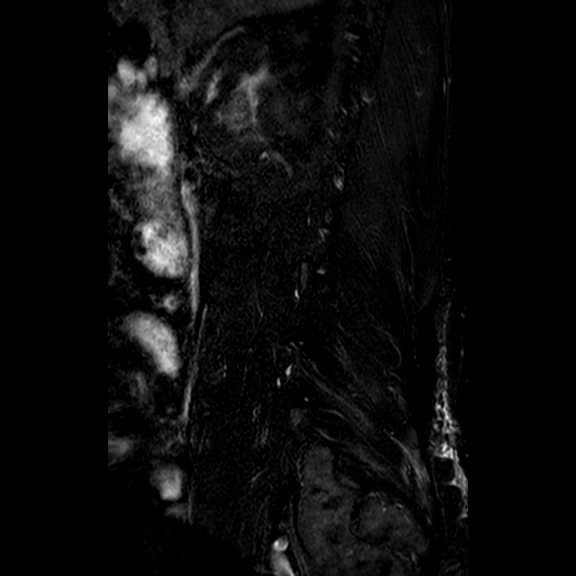

[Series 403: st2w_mdixon_tse · sagittal · 4.0mm · 0.47mm/px · 3 of 19 slices shown]
[im 1/19]
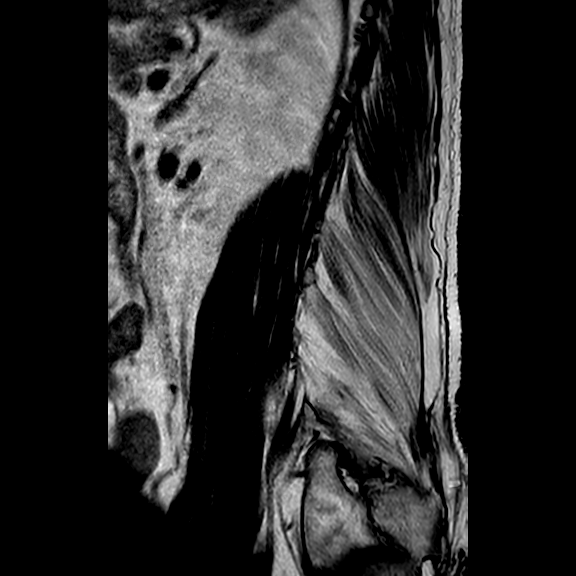
[im 10/19]
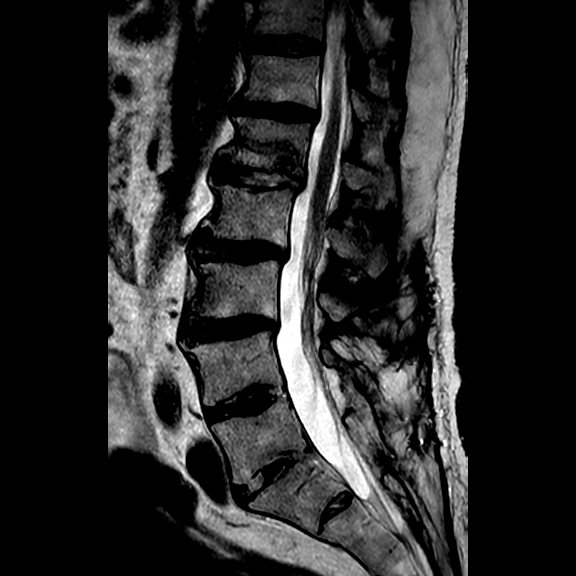
[im 19/19]
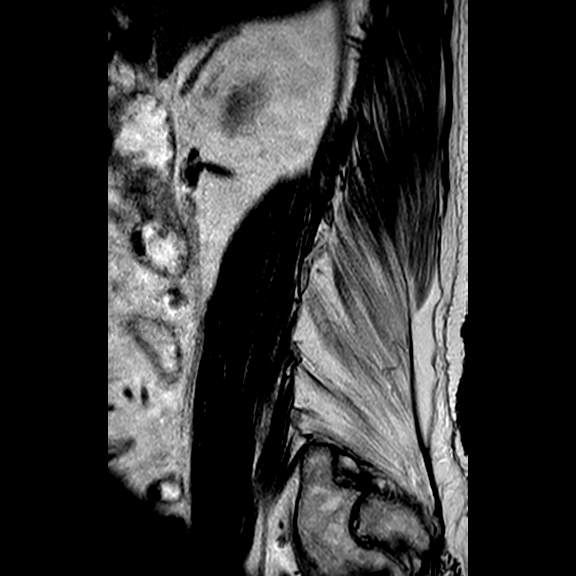

[Series 502: (id) view_ax mpr · axial · 1.0mm · 0.25mm/px · 1 of 128 slices shown]
[im 8/128]
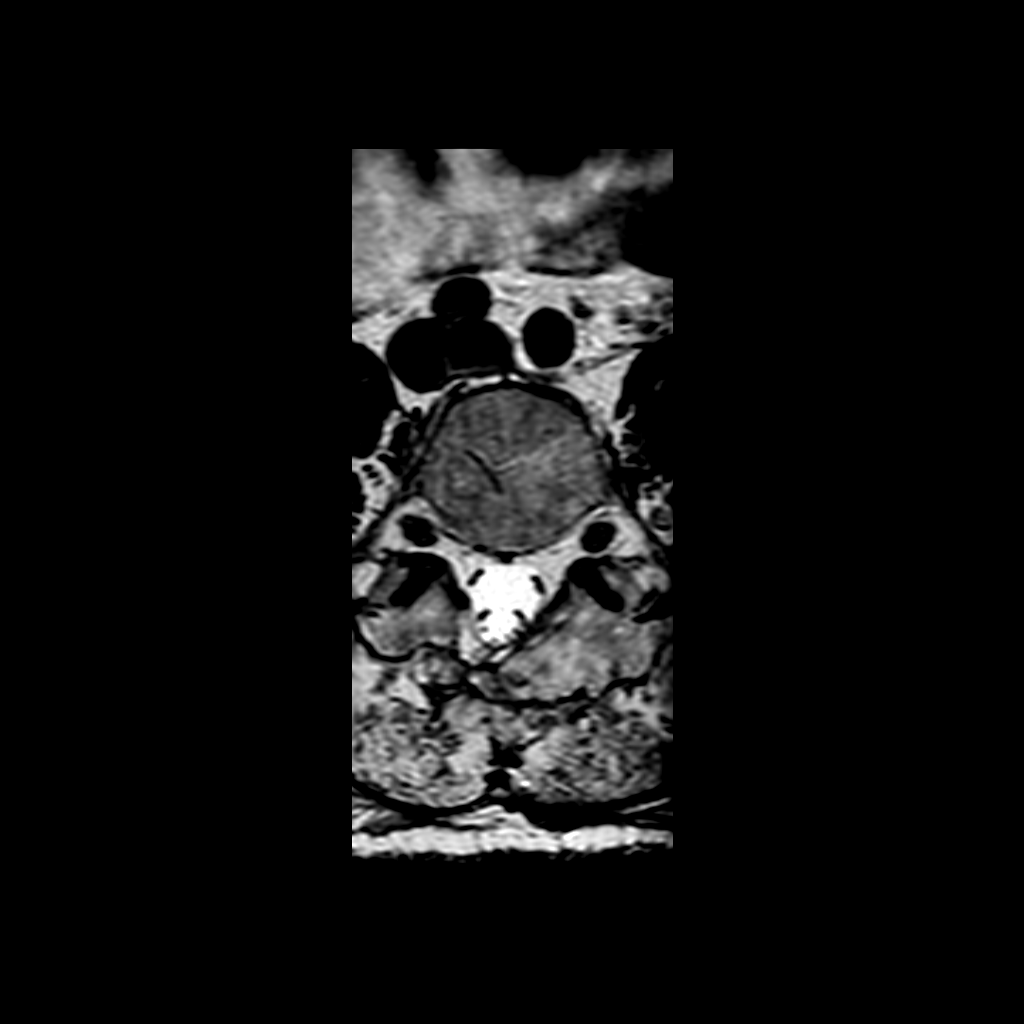

[12 of 48 positions shown; findings below may reference images not displayed]

FINDINGS: Laminectomy change L4-L5 and L5-S1 again noted. There is osseous 
bridging across the facet joints of the fusion levels. 
-------------------------------------------------------------------------------- 
------ 
GENERAL: 
Nomenclature is based on 5 lumbar type vertebral bodies.     
ALIGNMENT: Normal coronal alignment. Stepwise grade 1 retrolisthesis T12 on L1, 
L1 on L2, L2 on L3 and L3 on L4. Alignment stable. 
VERTEBRAL BODY HEIGHT: Normal.  
MARROW SIGNAL: There is stable focal T1 and T2 hypointense endplate signal 
changes along the inferior endplate of L1. These are likely degenerative in 
nature and stability over time is reassuring. No convincing evidence of a marrow 
infiltrative process. 
CORD SIGNAL: Normal distal spinal cord and cauda equina. Conus medullaris 
terminates at L2-L3. 
ADDITIONAL FINDINGS: Fatty atrophy of the posterior paraspinous musculature. 
Modic I-II: L1-L2, L2-L3 and L3-L4. 
Ligamentum Flavum > 2.5 mm: All except the laminectomy sites. 
-------------------------------------------------------------------------------- 
------ 
SEGMENTAL: 
T12-L1: Loss of disc signal. Minimal annular bulge. Otherwise normal. Stable. 
L1-L2: Mild loss of disc height and signal. Small Schmorls node. Disc 
unroofing. Mild canal stenosis with mild right foraminal narrowing. Moderate 
left foraminal narrowing. Stable. 
L2-L3: Loss of disc signal. Anterior osteophytes. Minimal annular bulge. Canal 
and right foramen are patent. Mild left foraminal narrowing. Small left facet 
joint effusion. Stable. 
L3-L4: Mild loss of disc height and signal. Canal patent. Moderate right and 
mild to moderate left foraminal narrowing. Facet arthropathy. Stable. 
L4-L5: Evidence of early mineralization of the disc space. Loss of disc signal 
otherwise. Mild loss of disc height. Fusion of the posterior facets. Canal and 
foramina are patent. Stable. 
L5-S1: Evidence of osseous bridging across the disc space. Loss of disc signal. 
Canal and foramina are patent with osseous fusion of the posterior facets. 
Stable. 
-------------------------------------------------------------------------------- 
------
IMPRESSION: Previous laminectomy L4-L5 and L5-S1 with osseous fusion across the facet joints 
of the fusion levels. 
Stable multilevel lumbar degenerative changes without critical or significant 
canal stenosis. Variable and stable appearing multilevel foraminal narrowing 
detailed above.

## 2023-02-01 IMAGING — DX LUMBAR SPINE 2 VIEW
1 series · 3 of 3 positions shown · non-contrast
Comparison: March 2022

________________________________________________________________________________________________ 
LUMBAR SPINE 2 VIEW, 02/01/2023 [DATE]: 
CLINICAL INDICATION: Postop

[Series 1: lateral · 0.14mm/px · 3 of 3 slices shown]
[im 1/3]
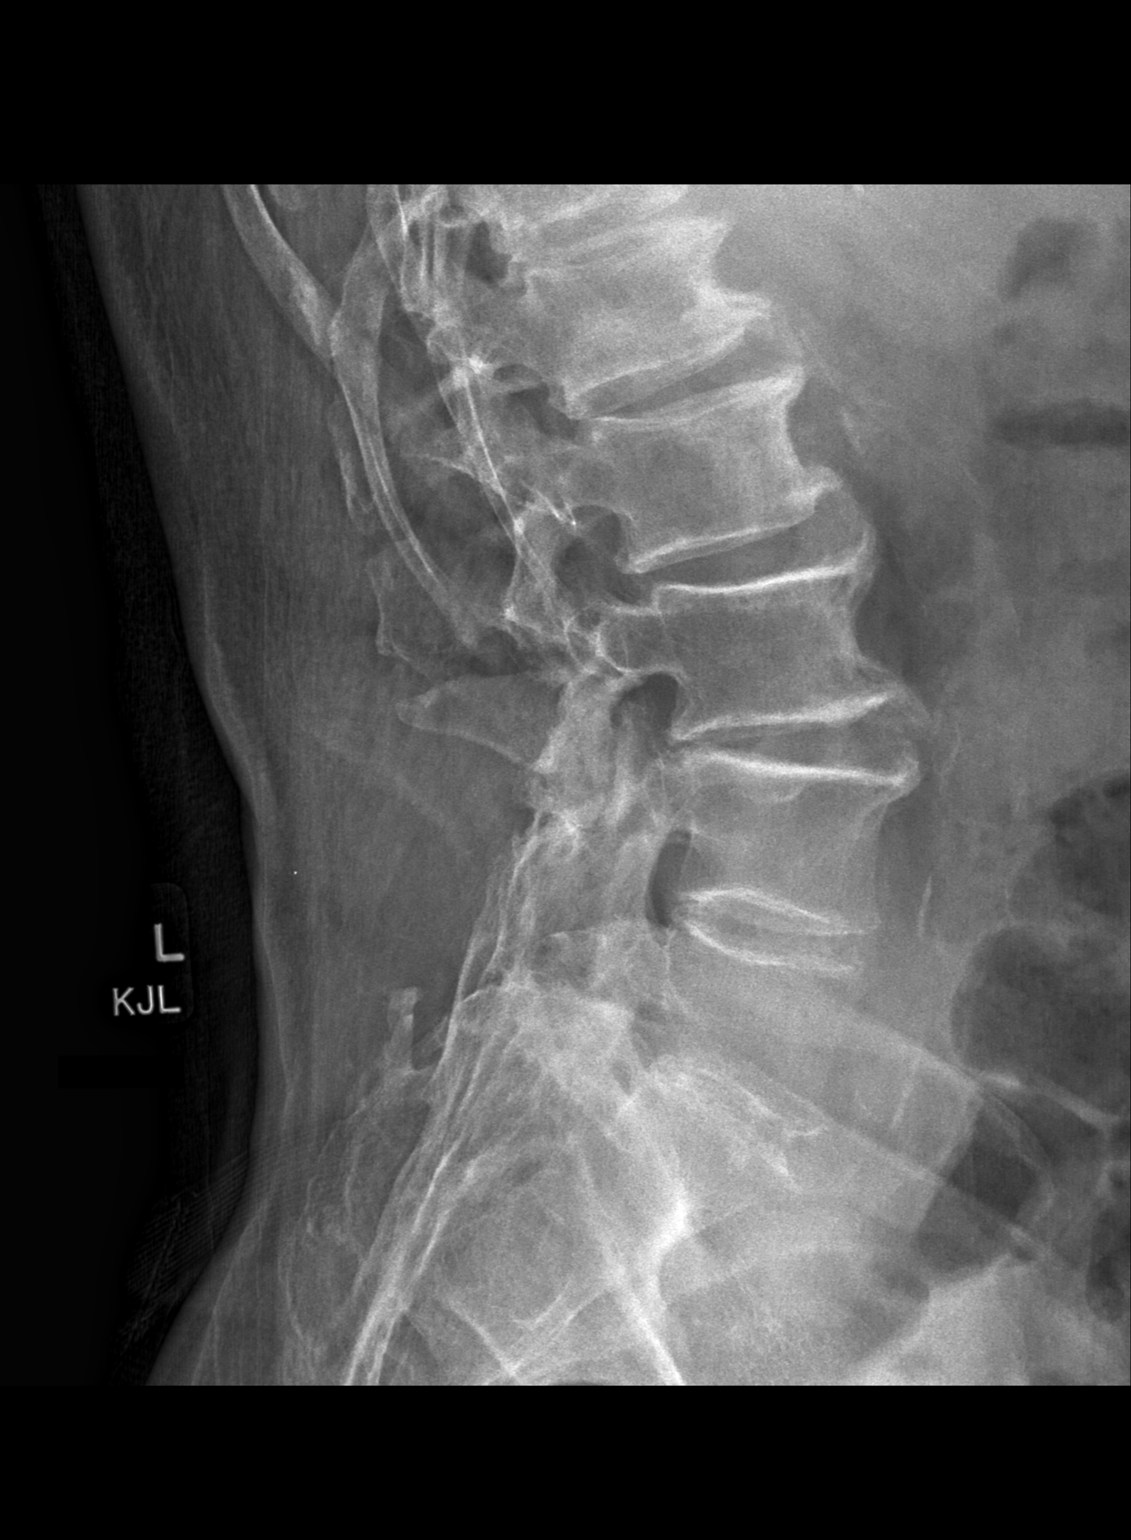
[im 2/3]
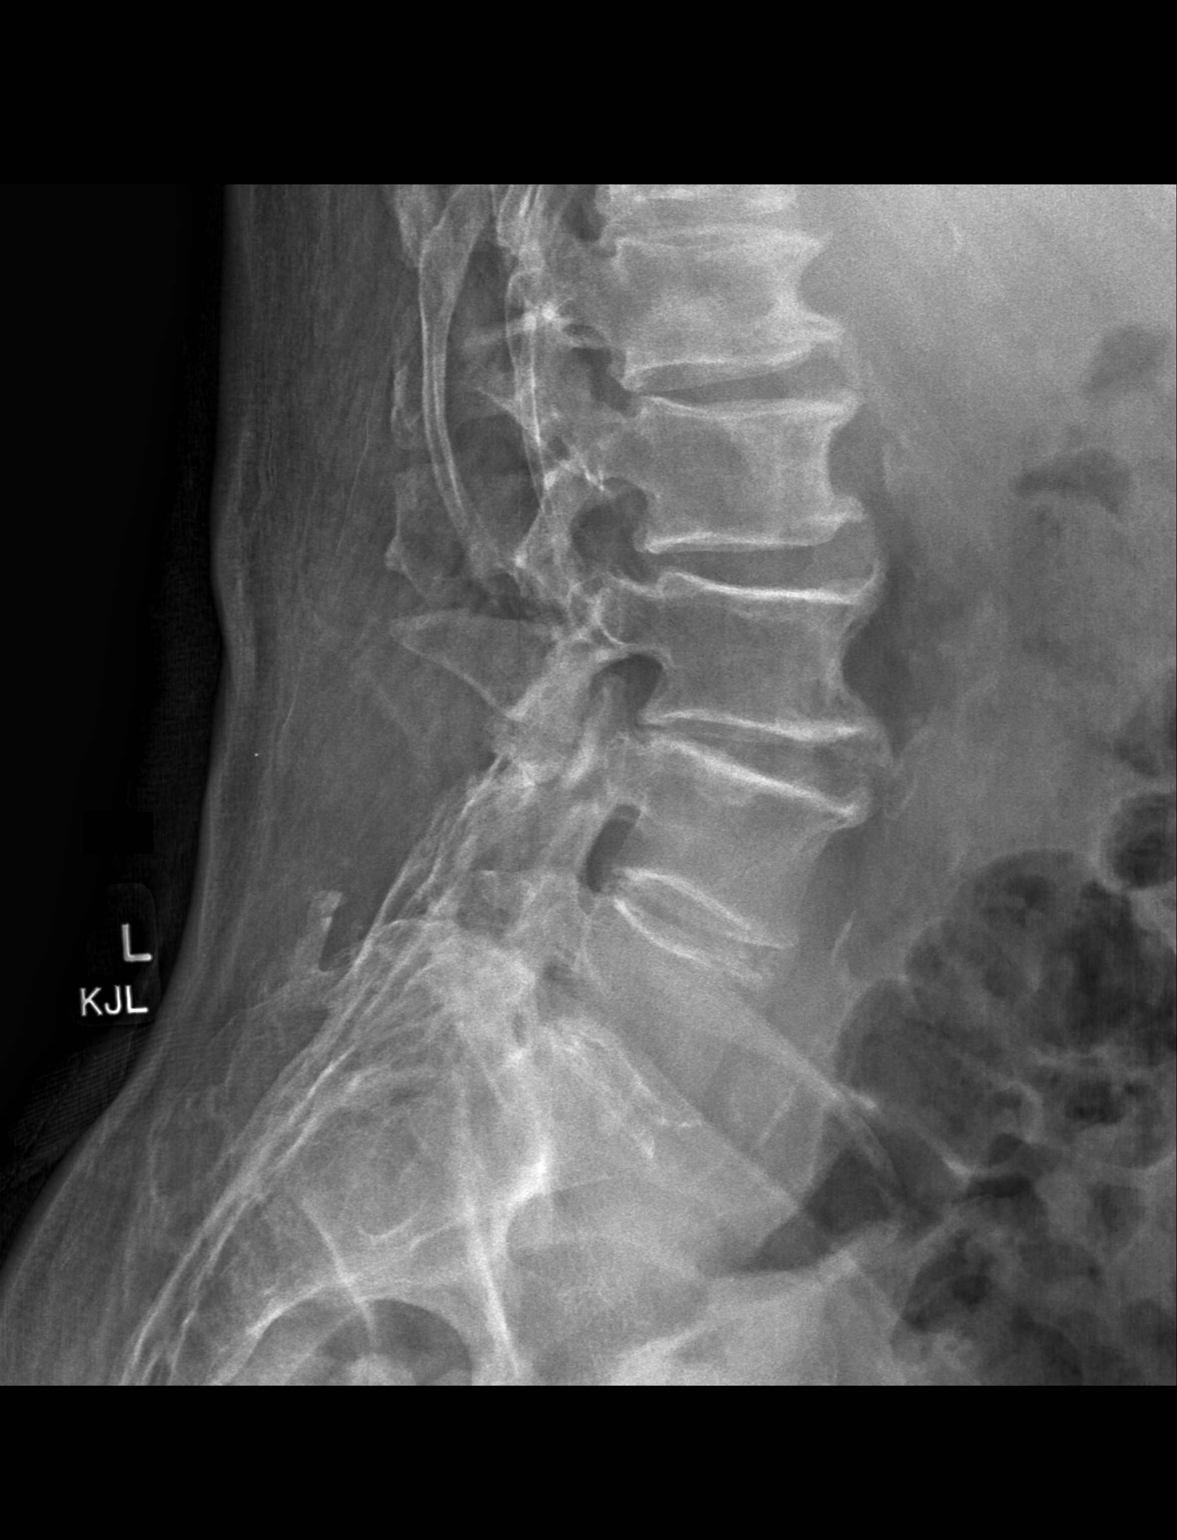
[im 3/3]
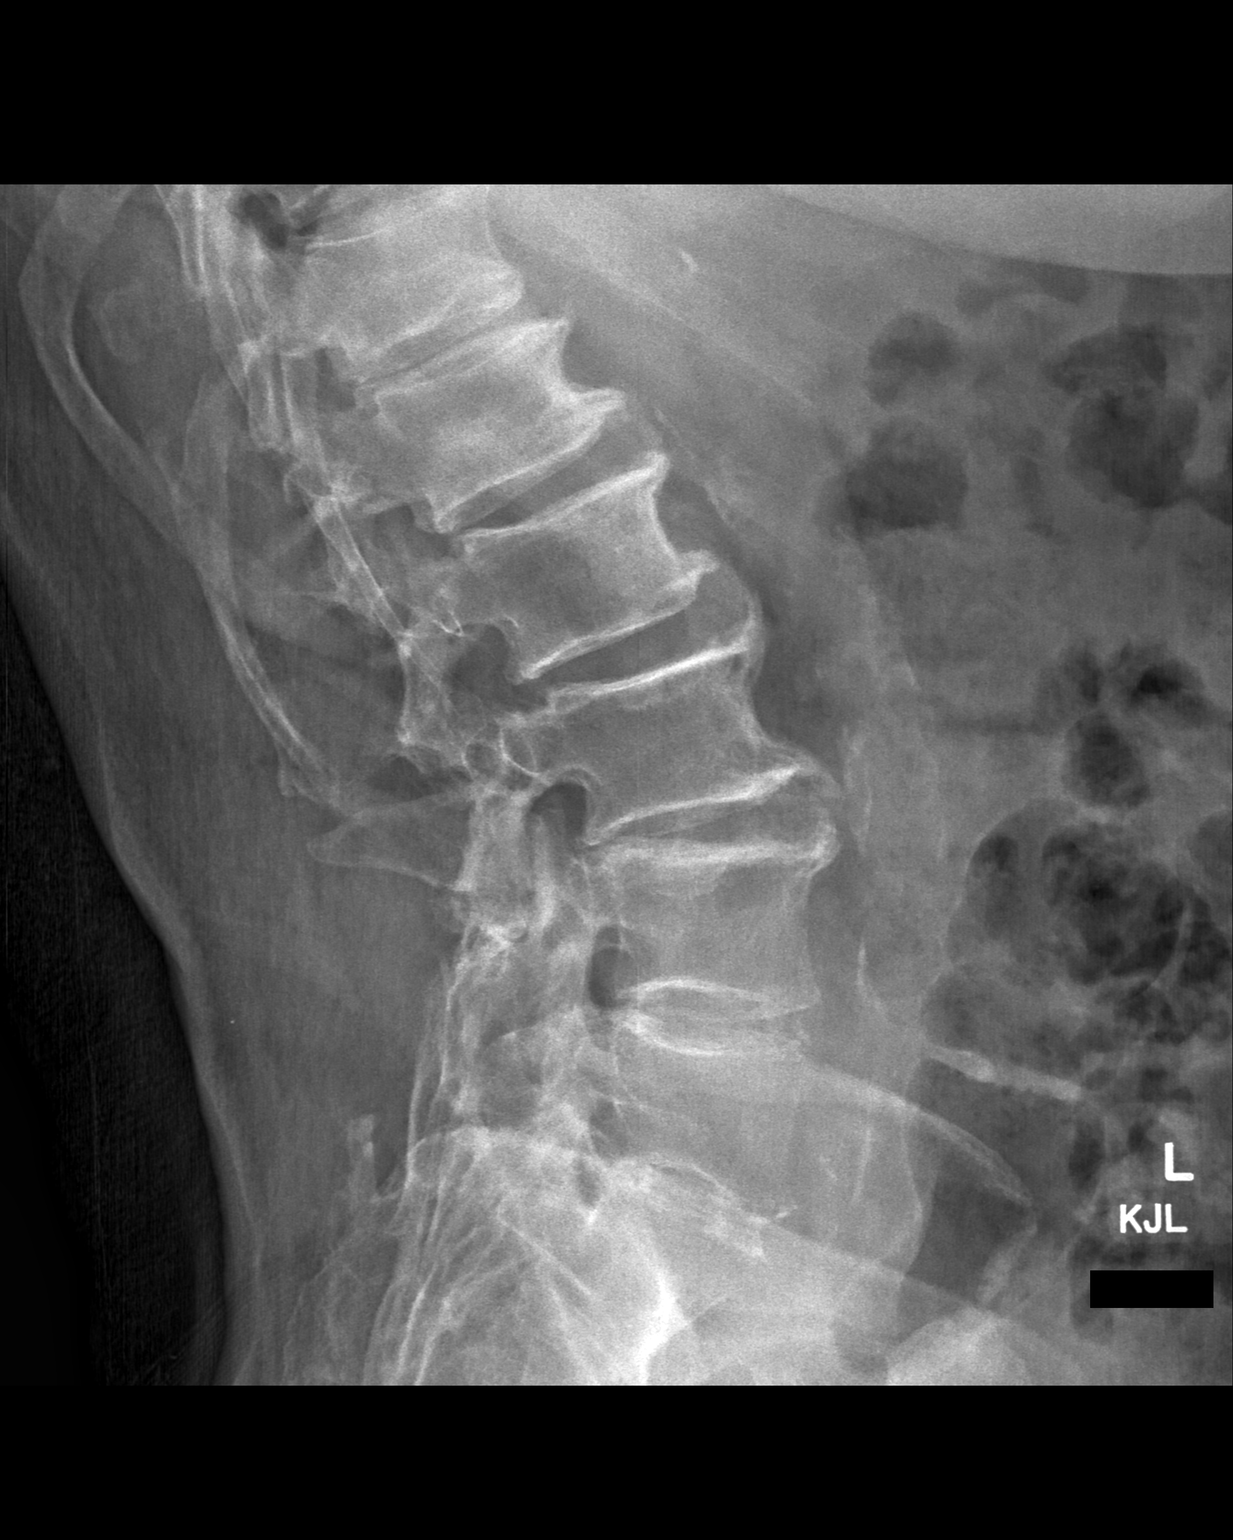

[3 of 3 positions shown; findings below may reference images not displayed]

FINDINGS: Postoperative changes with posterior osseous fusion lower lumbar 
spine. Flexion and extension views do not reveal subluxation. Endplate changes 
and osteophyte formation identified. Findings very similar to the prior 
examination.
IMPRESSION: Stable exam.
